# Patient Record
Sex: Female | Born: 1985 | Race: Black or African American | Hispanic: No | Marital: Single | Smoking: Current every day smoker
Health system: Southern US, Community
[De-identification: ages and names within clinical notes are randomized; demographics above are authoritative.]

## PROBLEM LIST (undated history)

## (undated) DIAGNOSIS — F32A Depression, unspecified: Secondary | ICD-10-CM

## (undated) DIAGNOSIS — N83209 Unspecified ovarian cyst, unspecified side: Secondary | ICD-10-CM

## (undated) DIAGNOSIS — O139 Gestational [pregnancy-induced] hypertension without significant proteinuria, unspecified trimester: Secondary | ICD-10-CM

## (undated) DIAGNOSIS — I219 Acute myocardial infarction, unspecified: Secondary | ICD-10-CM

## (undated) DIAGNOSIS — B999 Unspecified infectious disease: Secondary | ICD-10-CM

## (undated) DIAGNOSIS — A549 Gonococcal infection, unspecified: Secondary | ICD-10-CM

## (undated) DIAGNOSIS — F329 Major depressive disorder, single episode, unspecified: Secondary | ICD-10-CM

## (undated) DIAGNOSIS — R87629 Unspecified abnormal cytological findings in specimens from vagina: Secondary | ICD-10-CM

## (undated) HISTORY — PX: NO PAST SURGERIES: SHX2092

---
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
## 2006-02-26 ENCOUNTER — Inpatient Hospital Stay (HOSPITAL_COMMUNITY): Admission: AD | Admit: 2006-02-26 | Discharge: 2006-02-26 | Payer: Self-pay | Admitting: Obstetrics & Gynecology

## 2006-04-22 ENCOUNTER — Encounter: Payer: Self-pay | Admitting: Cardiology

## 2006-04-22 ENCOUNTER — Ambulatory Visit: Payer: Self-pay | Admitting: Cardiology

## 2006-04-22 ENCOUNTER — Inpatient Hospital Stay (HOSPITAL_COMMUNITY): Admission: EM | Admit: 2006-04-22 | Discharge: 2006-04-23 | Payer: Self-pay | Admitting: Emergency Medicine

## 2006-05-16 ENCOUNTER — Emergency Department (HOSPITAL_COMMUNITY): Admission: EM | Admit: 2006-05-16 | Discharge: 2006-05-17 | Payer: Self-pay | Admitting: Emergency Medicine

## 2007-01-29 ENCOUNTER — Inpatient Hospital Stay (HOSPITAL_COMMUNITY): Admission: AD | Admit: 2007-01-29 | Discharge: 2007-01-29 | Payer: Self-pay | Admitting: Obstetrics and Gynecology

## 2008-03-15 IMAGING — CR DG WRIST COMPLETE 3+V*R*
3 series · 3 of 3 positions shown · non-contrast
Comparison: none

CLINICAL DATA: Laceration to posterior side of hand this AM.
 RIGHT WRIST ? 3 VIEW:

[x wrist pa right]
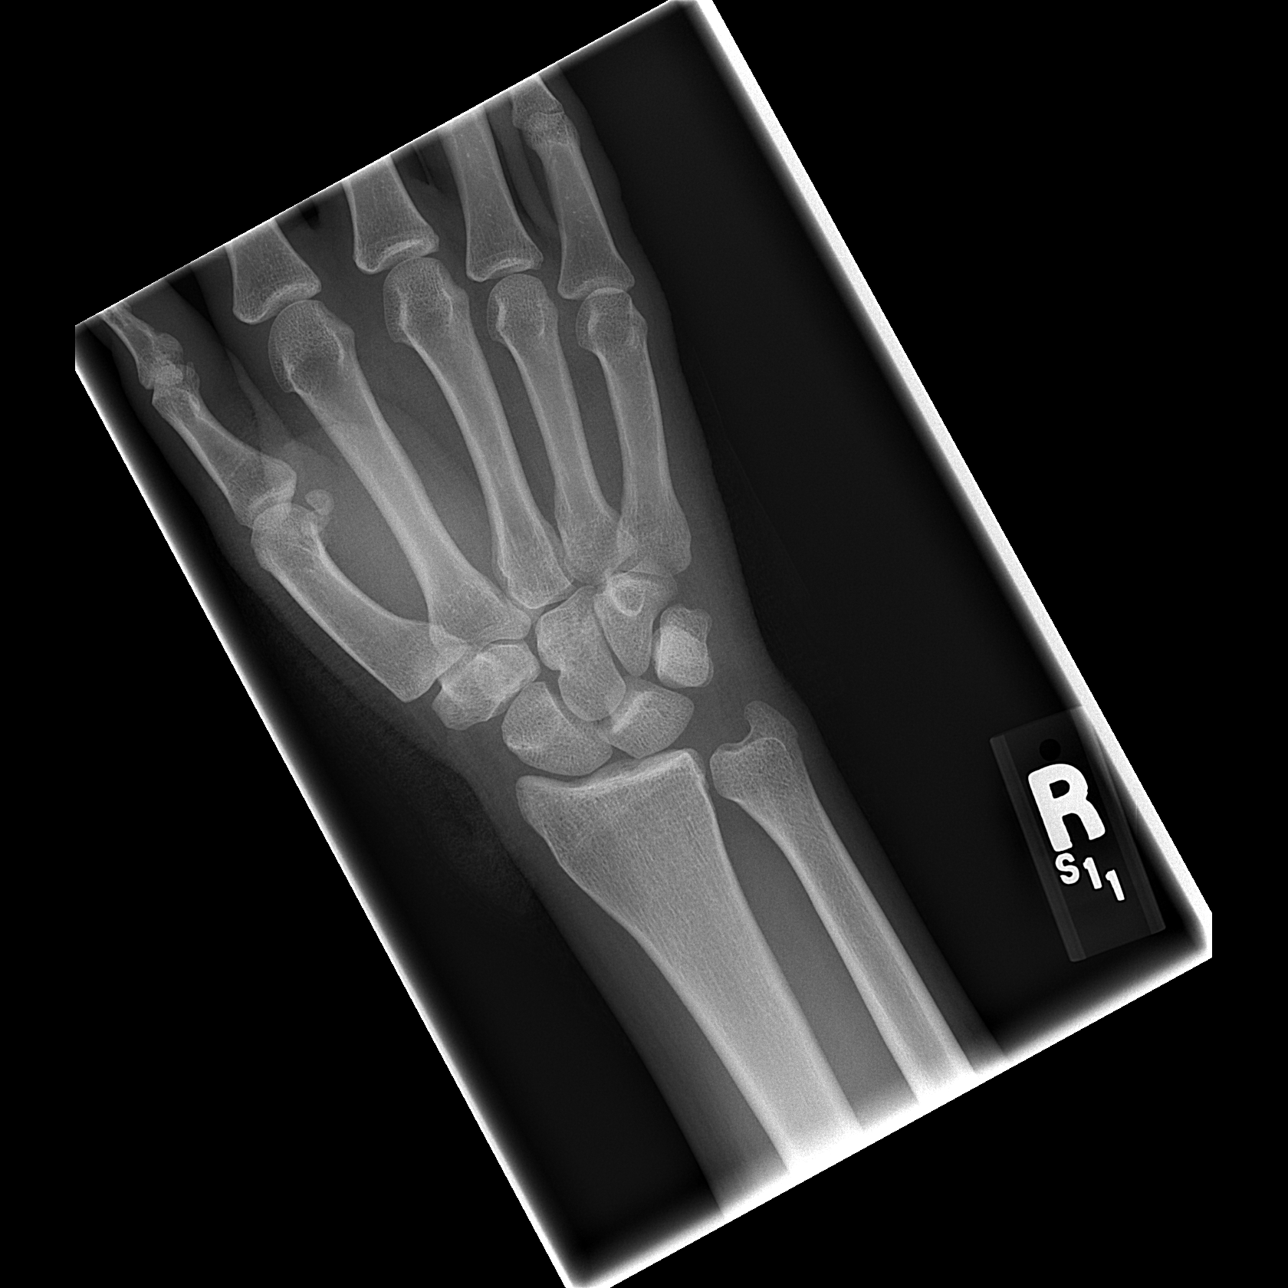

[x wrist obl right]
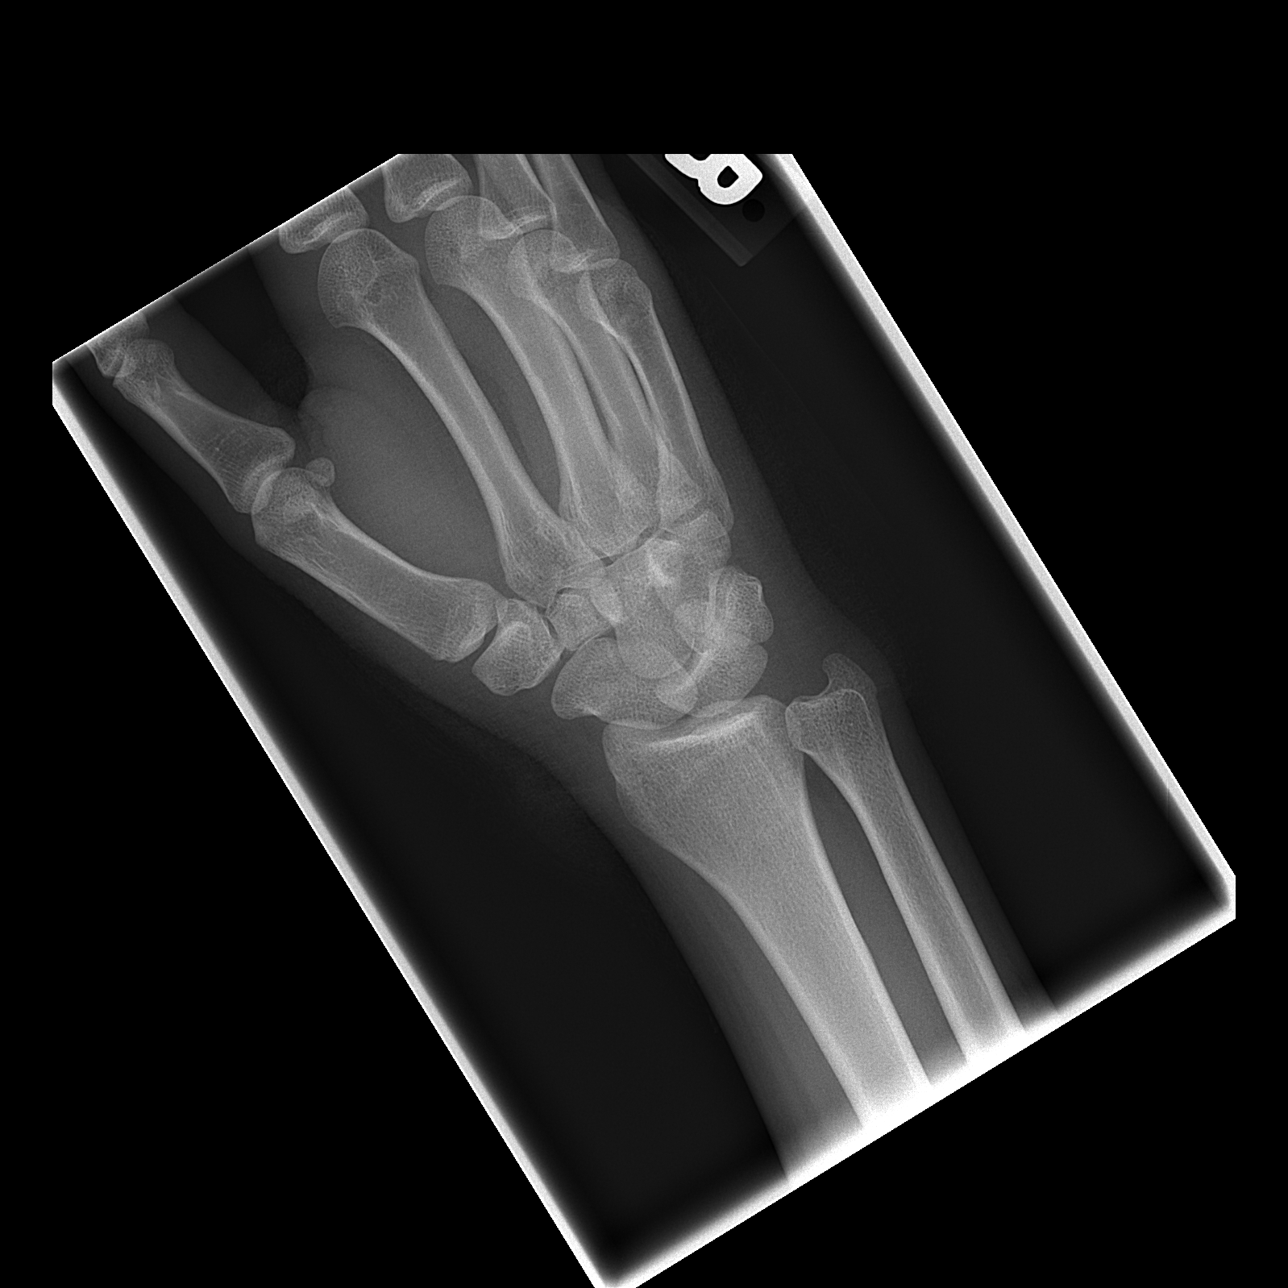

[x wrist lat right]
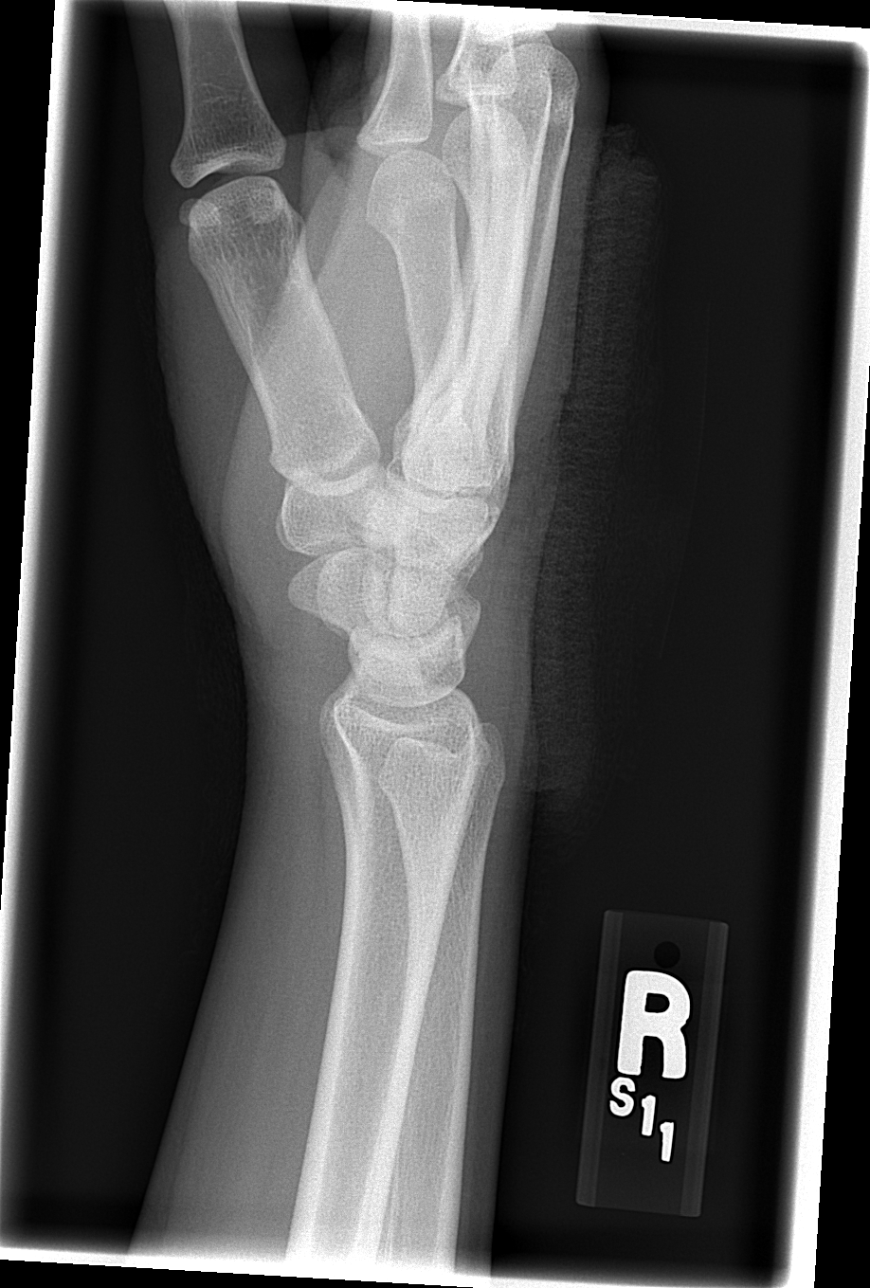

[3 of 3 positions shown; findings below may reference images not displayed]

FINDINGS: There is overlying gauze material along the dorsal surface of the hand.  
 There are no underlying fractures or dislocations noted.
IMPRESSION: No acute osseous abnormalities.

## 2008-04-28 ENCOUNTER — Inpatient Hospital Stay (HOSPITAL_COMMUNITY): Admission: AD | Admit: 2008-04-28 | Discharge: 2008-04-29 | Payer: Self-pay | Admitting: Family Medicine

## 2008-07-12 ENCOUNTER — Ambulatory Visit: Payer: Self-pay | Admitting: Obstetrics & Gynecology

## 2008-07-12 ENCOUNTER — Inpatient Hospital Stay (HOSPITAL_COMMUNITY): Admission: AD | Admit: 2008-07-12 | Discharge: 2008-07-12 | Payer: Self-pay | Admitting: Obstetrics & Gynecology

## 2008-07-19 ENCOUNTER — Inpatient Hospital Stay (HOSPITAL_COMMUNITY): Admission: AD | Admit: 2008-07-19 | Discharge: 2008-07-21 | Payer: Self-pay | Admitting: Obstetrics & Gynecology

## 2008-07-19 ENCOUNTER — Ambulatory Visit: Payer: Self-pay | Admitting: Obstetrics and Gynecology

## 2008-09-06 IMAGING — CR DG CHEST 2V
2 series · 2 of 2 positions shown · non-contrast
Comparison: none

CLINICAL DATA: Cough and chest pain.
 CHEST - 2 VIEW:
 PA and lateral chest.  No comparison.

[w chest pa]
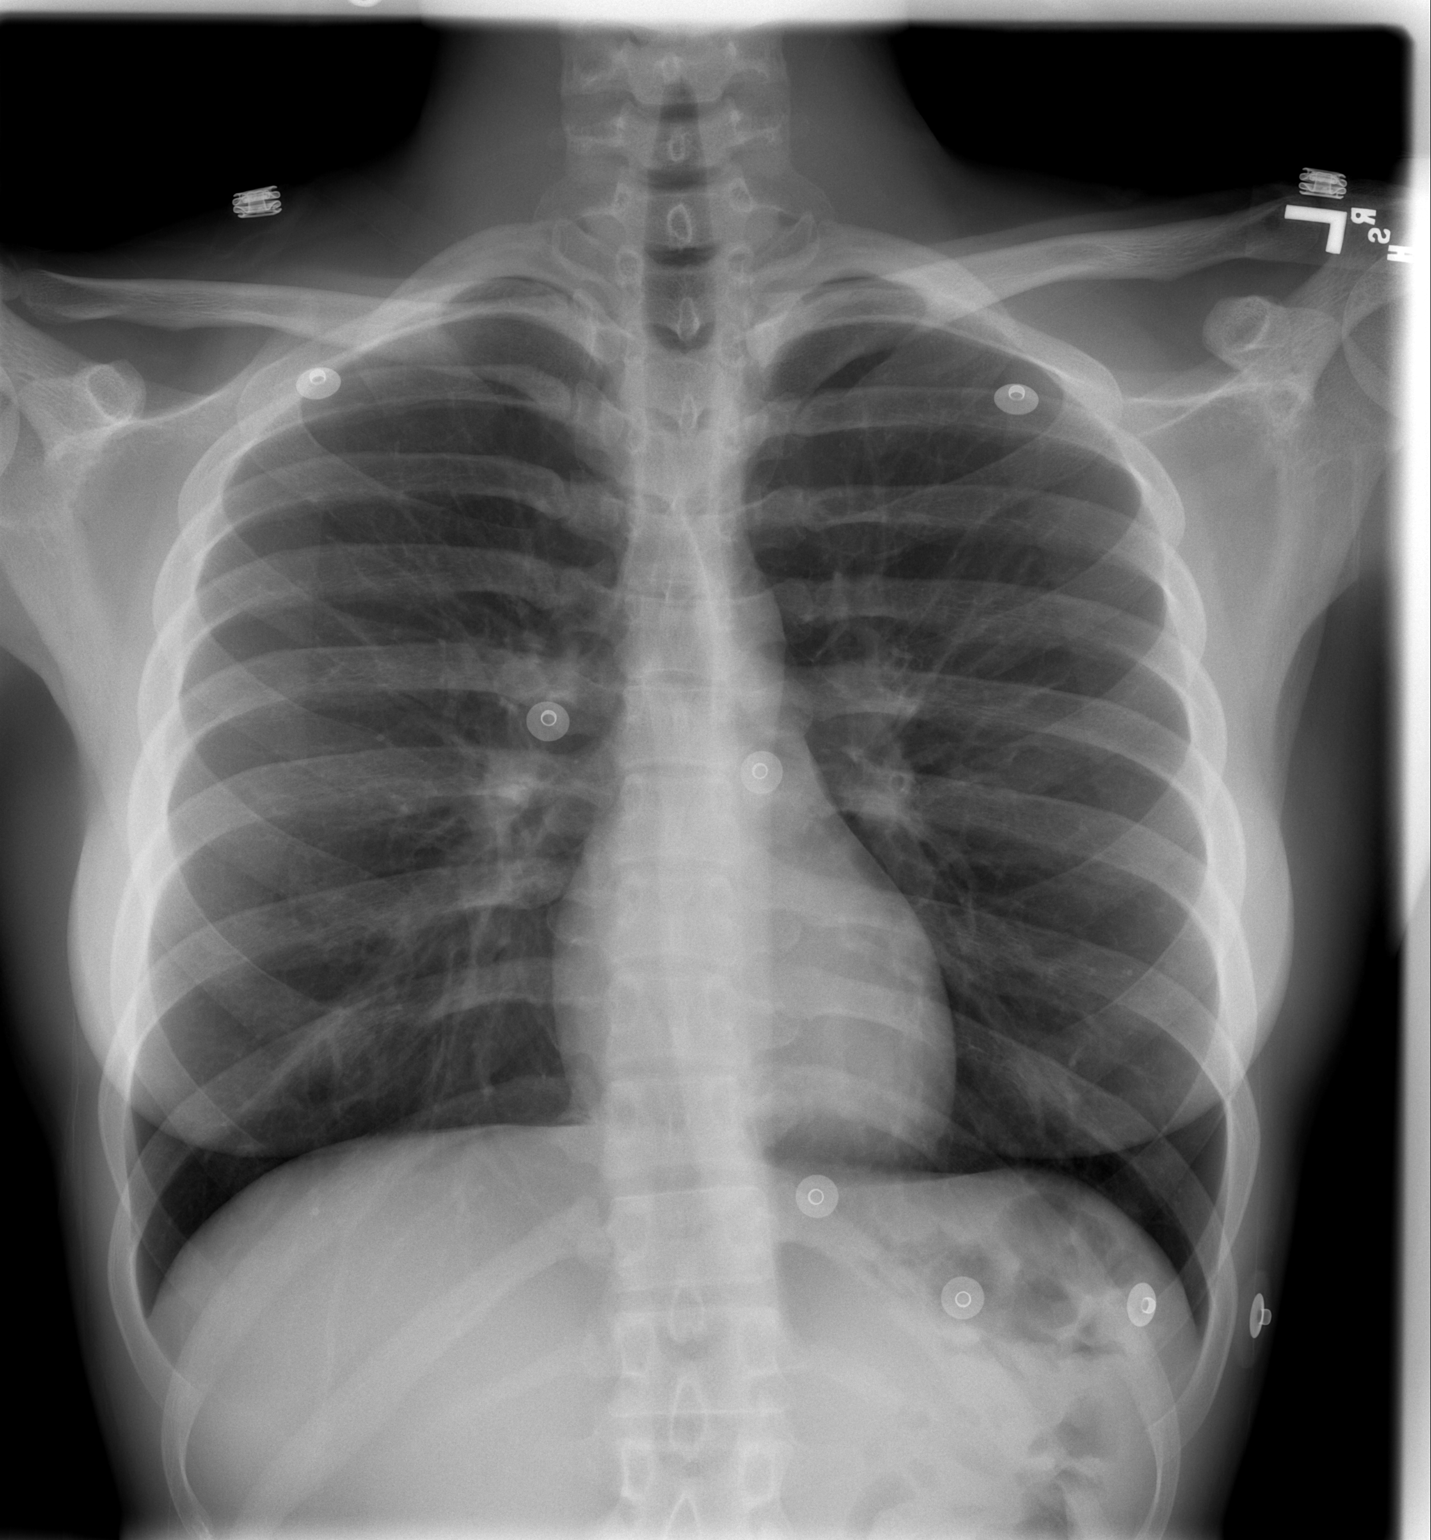

[w chest lat]
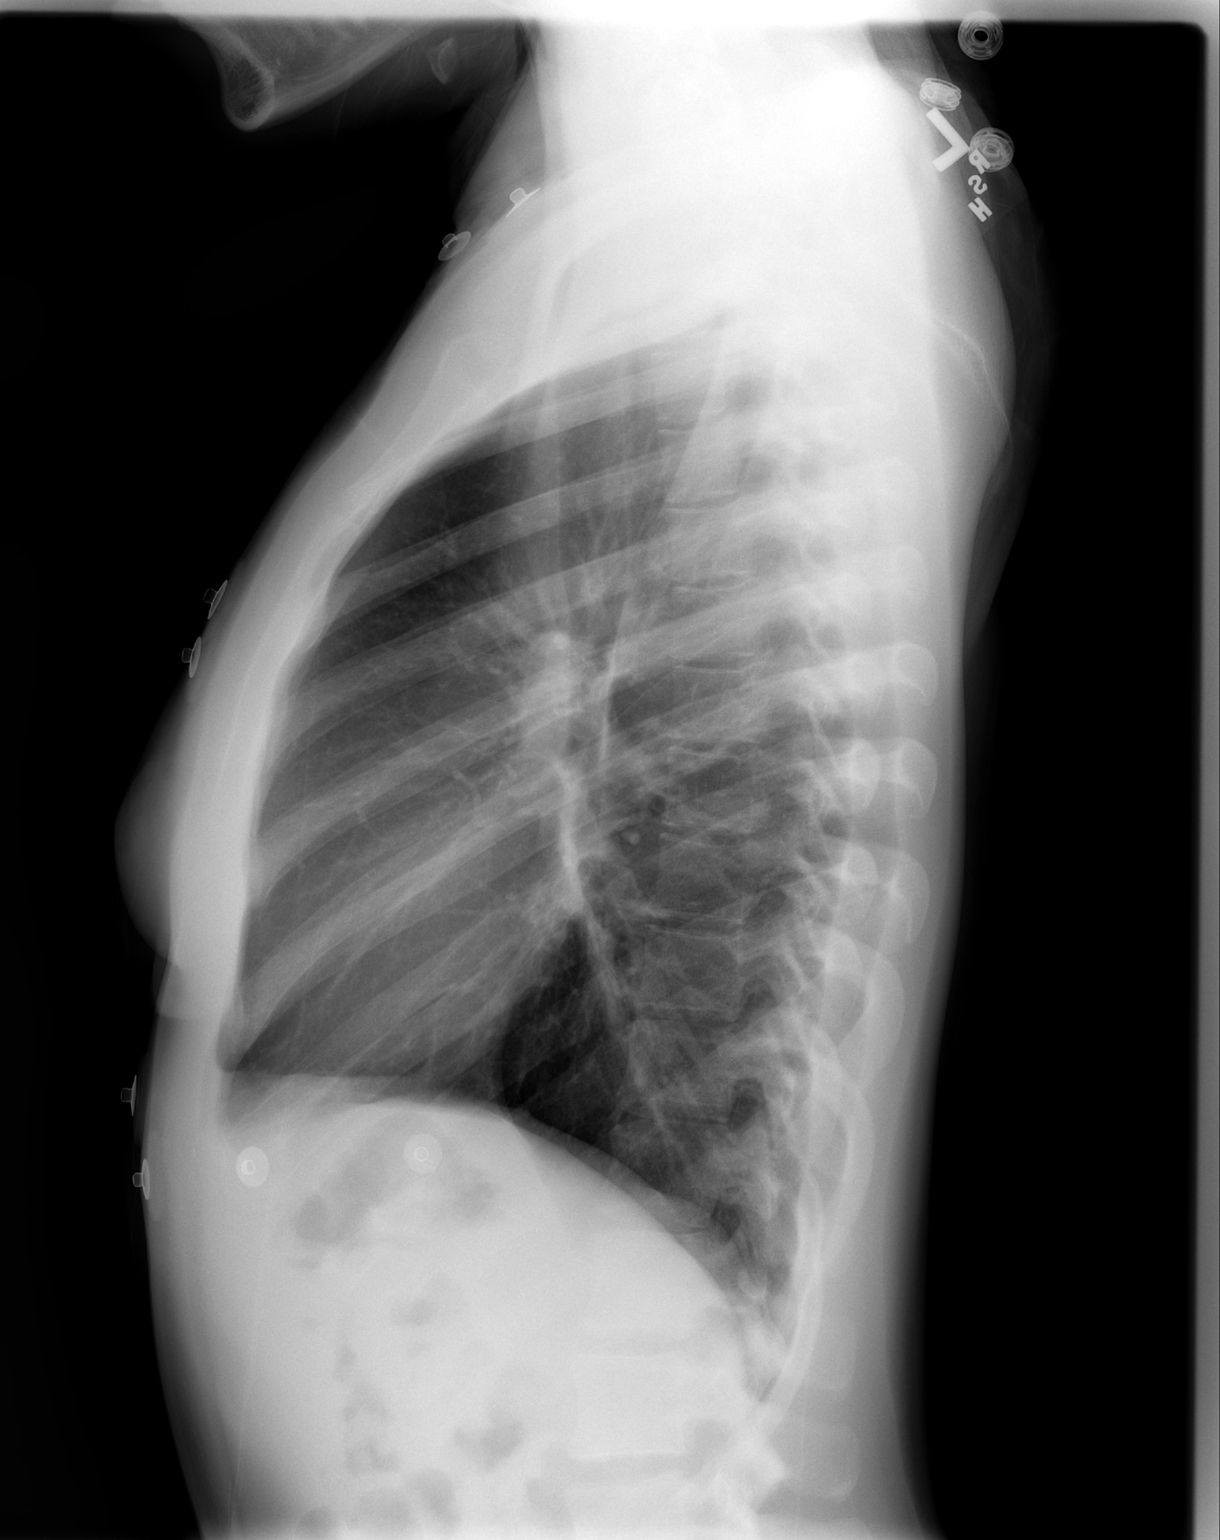

[2 of 2 positions shown; findings below may reference images not displayed]

FINDINGS: The heart size and mediastinal contours are within normal limits.  Both lungs are clear.  The visualized skeletal structures are within normal limits.
IMPRESSION: No active cardiopulmonary disease.

## 2009-10-02 ENCOUNTER — Emergency Department (HOSPITAL_COMMUNITY): Admission: EM | Admit: 2009-10-02 | Discharge: 2009-10-02 | Payer: Self-pay | Admitting: Emergency Medicine

## 2009-11-12 ENCOUNTER — Encounter: Payer: Self-pay | Admitting: Obstetrics & Gynecology

## 2009-11-12 ENCOUNTER — Ambulatory Visit: Payer: Self-pay | Admitting: Obstetrics & Gynecology

## 2009-11-12 ENCOUNTER — Inpatient Hospital Stay (HOSPITAL_COMMUNITY): Admission: AD | Admit: 2009-11-12 | Discharge: 2009-11-14 | Payer: Self-pay | Admitting: Obstetrics & Gynecology

## 2010-02-23 ENCOUNTER — Encounter: Payer: Self-pay | Admitting: *Deleted

## 2010-04-17 LAB — STREP B DNA PROBE

## 2010-04-17 LAB — CBC
HCT: 33.8 % — ABNORMAL LOW (ref 36.0–46.0)
Hemoglobin: 11.5 g/dL — ABNORMAL LOW (ref 12.0–15.0)
MCH: 34.4 pg — ABNORMAL HIGH (ref 26.0–34.0)
MCHC: 34.1 g/dL (ref 30.0–36.0)
MCV: 101 fL — ABNORMAL HIGH (ref 78.0–100.0)
Platelets: 222 10*3/uL (ref 150–400)
RBC: 3.35 MIL/uL — ABNORMAL LOW (ref 3.87–5.11)
RDW: 13.6 % (ref 11.5–15.5)
WBC: 12.6 10*3/uL — ABNORMAL HIGH (ref 4.0–10.5)

## 2010-04-17 LAB — URINALYSIS, ROUTINE W REFLEX MICROSCOPIC
Bilirubin Urine: NEGATIVE
Glucose, UA: 100 mg/dL — AB
Ketones, ur: NEGATIVE mg/dL
Nitrite: NEGATIVE
Urobilinogen, UA: 1 mg/dL (ref 0.0–1.0)

## 2010-04-17 LAB — DIFFERENTIAL
Basophils Absolute: 0 10*3/uL (ref 0.0–0.1)
Basophils Relative: 0 % (ref 0–1)
Eosinophils Absolute: 0.2 10*3/uL (ref 0.0–0.7)
Eosinophils Relative: 2 % (ref 0–5)
Lymphocytes Relative: 31 % (ref 12–46)
Lymphs Abs: 3.9 10*3/uL (ref 0.7–4.0)
Monocytes Absolute: 1 10*3/uL (ref 0.1–1.0)
Monocytes Relative: 8 % (ref 3–12)
Neutro Abs: 7.6 10*3/uL (ref 1.7–7.7)
Neutrophils Relative %: 60 % (ref 43–77)

## 2010-04-17 LAB — COMPREHENSIVE METABOLIC PANEL
Alkaline Phosphatase: 155 U/L — ABNORMAL HIGH (ref 39–117)
BUN: 3 mg/dL — ABNORMAL LOW (ref 6–23)
CO2: 25 mEq/L (ref 19–32)
Chloride: 105 mEq/L (ref 96–112)
Creatinine, Ser: 0.54 mg/dL (ref 0.4–1.2)
GFR calc non Af Amer: >60 mL/min (ref >60)
Glucose, Bld: 97 mg/dL (ref 70–99)
Potassium: 3.4 mEq/L — ABNORMAL LOW (ref 3.5–5.1)
Total Bilirubin: 0.2 mg/dL — ABNORMAL LOW (ref 0.3–1.2)

## 2010-04-17 LAB — COCAINE, URINE, CONFIRMATION: Benzoylecgonine GC/MS Conf: 42775 NG/ML — ABNORMAL HIGH

## 2010-04-17 LAB — URIC ACID: Uric Acid, Serum: 3 mg/dL (ref 2.4–7.0)

## 2010-04-17 LAB — RAPID HIV SCREEN (WH-MAU): Rapid HIV Screen: NONREACTIVE

## 2010-04-17 LAB — DRUGS OF ABUSE SCREEN W/O ALC, ROUTINE URINE
Amphetamine Screen, Ur: NEGATIVE
Barbiturate Quant, Ur: NEGATIVE
Cocaine Metabolites: POSITIVE — AB
Creatinine,U: 50.2 mg/dL
Marijuana Metabolite: NEGATIVE
Propoxyphene: NEGATIVE

## 2010-04-17 LAB — URINE MICROSCOPIC-ADD ON

## 2010-04-17 LAB — BENZODIAZEPINE, QUANTITATIVE, URINE
Flurazepam GC/MS Conf: NEGATIVE NG/ML
Lorazepam UR QT: NEGATIVE NG/ML

## 2010-04-17 LAB — LACTATE DEHYDROGENASE: LDH: 136 U/L (ref 94–250)

## 2010-04-17 LAB — TYPE AND SCREEN
ABO/RH(D): O POS
Antibody Screen: NEGATIVE

## 2010-04-17 LAB — RUBELLA SCREEN: Rubella: 21.7 IU/mL — ABNORMAL HIGH

## 2010-04-17 LAB — RPR: RPR Ser Ql: NONREACTIVE

## 2010-04-17 LAB — HEPATITIS B SURFACE ANTIGEN: Hepatitis B Surface Ag: NEGATIVE

## 2010-05-12 LAB — RAPID HIV SCREEN (WH-MAU): Rapid HIV Screen: NONREACTIVE

## 2010-05-12 LAB — URINE MICROSCOPIC-ADD ON

## 2010-05-12 LAB — URINE CULTURE
Colony Count: NO GROWTH
Culture: NO GROWTH

## 2010-05-12 LAB — COMPREHENSIVE METABOLIC PANEL
AST: 23 U/L (ref 0–37)
Albumin: 3.4 g/dL — ABNORMAL LOW (ref 3.5–5.2)
Alkaline Phosphatase: 141 U/L — ABNORMAL HIGH (ref 39–117)
BUN: 5 mg/dL — ABNORMAL LOW (ref 6–23)
CO2: 21 mEq/L (ref 19–32)
Chloride: 102 mEq/L (ref 96–112)
Potassium: 3.8 mEq/L (ref 3.5–5.1)
Total Bilirubin: 1 mg/dL (ref 0.3–1.2)

## 2010-05-12 LAB — RAPID URINE DRUG SCREEN, HOSP PERFORMED
Benzodiazepines: NOT DETECTED
Cocaine: POSITIVE — AB
Tetrahydrocannabinol: NOT DETECTED

## 2010-05-12 LAB — DIFFERENTIAL
Basophils Absolute: 0 10*3/uL (ref 0.0–0.1)
Eosinophils Relative: 2 % (ref 0–5)
Lymphocytes Relative: 22 % (ref 12–46)
Neutro Abs: 7.9 10*3/uL — ABNORMAL HIGH (ref 1.7–7.7)
Neutrophils Relative %: 68 % (ref 43–77)

## 2010-05-12 LAB — URINALYSIS, ROUTINE W REFLEX MICROSCOPIC
Protein, ur: NEGATIVE mg/dL
Urobilinogen, UA: 0.2 mg/dL (ref 0.0–1.0)

## 2010-05-12 LAB — RUBELLA SCREEN: Rubella: 16.3 IU/mL — ABNORMAL HIGH

## 2010-05-12 LAB — CBC
HCT: 36.1 % (ref 36.0–46.0)
Platelets: 190 10*3/uL (ref 150–400)
RDW: 13.6 % (ref 11.5–15.5)
WBC: 11.7 10*3/uL — ABNORMAL HIGH (ref 4.0–10.5)

## 2010-05-12 LAB — URIC ACID: Uric Acid, Serum: 3.3 mg/dL (ref 2.4–7.0)

## 2010-05-12 LAB — WET PREP, GENITAL: Clue Cells Wet Prep HPF POC: NONE SEEN

## 2010-05-12 LAB — GC/CHLAMYDIA PROBE AMP, GENITAL: GC Probe Amp, Genital: POSITIVE — AB

## 2010-05-12 LAB — HEPATITIS B SURFACE ANTIGEN: Hepatitis B Surface Ag: NEGATIVE

## 2010-05-12 LAB — TYPE AND SCREEN

## 2010-05-12 LAB — STREP B DNA PROBE: Strep Group B Ag: NEGATIVE

## 2010-06-20 NOTE — Discharge Summary (Signed)
NAMEGAVRIELLE, Pamela [name]              ACCOUNT NO.:  [id number]   MEDICAL RECORD NO.:  [id number]            PATIENT TYPE:   LOCATION:                                 FACILITY:   PHYSICIAN:  Pamela [name], MDDATE OF BIRTH:  1985-08-06   DATE OF ADMISSION:  DATE OF DISCHARGE:                               DISCHARGE SUMMARY   ADDENDUM:  The patient's chest x-ray showed no active cardiopulmonary  disease.   FOLLOWUP:  The patient has been asked to follow up with her primary care  physician.  She can follow up with Dr. Diona Browner in the [facility] on a p.r.n. basis.      Tereso Newcomer, PA-C      Pamela Buckles. Bensimhon, MD  Electronically Signed    SW/MEDQ  D:  04/23/2006  T:  04/23/2006  Job:  [id number]

## 2010-06-20 NOTE — Discharge Summary (Signed)
Pamela [name], Pamela [name]              ACCOUNT NO.:  [id number]   MEDICAL RECORD NO.:  [id number]          PATIENT TYPE:  INP   LOCATION:  4737                         FACILITY:  [facility]   PHYSICIAN:  Bevelyn Buckles. Bensimhon, MDDATE OF BIRTH:  05-Jan-1986   DATE OF ADMISSION:  04/22/2006  DATE OF DISCHARGE:  04/23/2006                               DISCHARGE SUMMARY   CARDIOLOGIST:  She is new to [facility].  She was initially seen  by Dr. Nona Dell.   PRIMARY CARE PHYSICIAN:  She follows with the [facility] as well as [facility].   REASON FOR ADMISSION:  Chest pain.   DISCHARGE DIAGNOSES:  1. Chest pain syndrome in the setting of cocaine abuse.      a.     Nonischemic Myoview study this admission.      b.     Good left ventricular function.  2. Substance abuse.   HISTORY:  Ms. Maiden is a 25 year old female with a history of cocaine  and crack abuse who presented to the emergency room on the date of  admission with complaints of chest pain.  She smoked crack for several  hours prior to presenting to the hospital.  Her initial point of care  markers were elevated with an MB of 35.6 and a troponin I 0.86.  There  were point of care markers.  She had not eaten in several days and  complained of being hungry.  Her EKG was without acute change.  She was  admitted for further evaluation and treatment.   HOSPITAL COURSE:  The patient was admitted for further evaluation and  treatment of her chest pain which seemed to be consistent with a non-ST-  elevation myocardial infarction in the setting of crack abuse.  No beta  blockers were used secondary to cocaine abuse.  She was placed on  aspirin, Nitroglycerin paste and Diltiazem.  No beta-blocker review  secondary to her recent history of cocaine abuse.  Her regular cardiac  markers returned normal x3.  Her LDL was 73.  TSH was normal 2.613.  A 2-  D echocardiogram showed normal LV function and  no wall motion  abnormalities.  She underwent adenosine Myoview testing on the date of  April 23, 2006.  This was reviewed by Dr. Gala Romney.  She had an EF of  61%, no ischemia or infarct.  She was felt stable enough for discharge  to home.   Regarding the patient's social situation,  she does note that she does  abuse crack cocaine.  She also has an uncertain living situation.  She  says she travels from place to place.  She does have a 26-year-old  daughter that lives with her foster mother.  We have asked social work  to come by and see the patient to help with living arrangements as well  as to help with outpatient treatment of her substance abuse prior to  discharge.   LABORATORY AND ANCILLARY DATA:  White count 9300, hemoglobin 12.8,  hematocrit 37.5, platelet count 261,000.  INR 1.1.  Sodium 137,  potassium 3.6, glucose 64, BUN 11, creatinine 0.76.  LFTs okay regular.  CK-MB and troponin I negative x3.  Lipid panel as noted above.  TSH as  noted above.  Urine drug screen positive for cocaine.   DISCHARGE MEDICATIONS:  None.   DIET:  Low-fat, low-sodium diet.   WOUND CARE:  Not applicable.   ACTIVITY:  She is increase her activity slowly.  She may return to work  on April 26, 2006.  She is advised to stop smoking cigarettes and to  stop using cocaine.  As noted above, social work will see her prior to  discharge to help with living arrangements as well as outpatient  treatment for her substance abuse.   TOTAL PHYSICIAN PA TIME:  Greater than 30 minutes on this discharge.      Tereso Newcomer, PA-C      Bevelyn Buckles. Bensimhon, MD  Electronically Signed    SW/MEDQ  D:  04/23/2006  T:  04/23/2006  Job:  [id number]   cc:   [facility]  [facility]

## 2010-06-20 NOTE — H&P (Signed)
[name], [name] NO.:  [id number]   MEDICAL RECORD NO.:  [id number]          PATIENT TYPE:  EMS   LOCATION:  MAJO                         FACILITY:  [facility]   PHYSICIAN:  Jonelle Sidle, MD DATE OF BIRTH:  30-Apr-1985   DATE OF ADMISSION:  04/22/2006  DATE OF DISCHARGE:                              HISTORY & PHYSICAL   PRIMARY CARE PHYSICIAN:  None.   REASON FOR ADMISSION:  Recent crack cocaine use, with chest pain and  abnormal cardiac markers.   HISTORY OF PRESENT ILLNESS:  Ms. Pamela [name] is a 25 year old woman with a  history of polysubstance abuse, including most recently crack cocaine  which she states she began using back in January 2008.  She reports no  other major medical conditions or hospitalizations.  She is presently  unemployed and lives from place to place, having dropped out of high  school in the ninth grade, and with one 50-year-old daughter who lives in  foster care.  She has recently been on a crack cocaine binge, stating  that she started smoking crack at 1 p.m. yesterday, through the entire  night, until approximately 4 a.m. this morning, at which point she began  to develop chest pain.  She states that a friend called EMS, and she was  transported to the emergency department.  Her electrocardiogram showed  sinus rhythm, with nonspecific ST changes.  There is no old tracing for  comparison.  Her point of care cardiac markers are abnormal, with a CK-  MB of 35.6, and a troponin I of 0.86.  Urine drug screen is positive for  cocaine. We were asked to evaluate the patient by emergency department  staff.  In speaking with her at the bedside, she denies having active  chest pain, stating only that she is hungry.   ALLERGIES:  PENICILLIN.   MEDICATIONS:  Naproxen p.r.n.   PAST MEDICAL HISTORY:  As outlined above.   SOCIAL HISTORY:  As discussed above.  Also includes one-half pack per  day tobacco use history for the last 2-3 years.  She  denies any alcohol  use.   FAMILY HISTORY:  No clear premature cardiovascular disease based on  limited information.  The patient states that her mother may have  cervical cancer at age 33.   REVIEW OF SYSTEMS:  As described in the history of present illness.  Otherwise negative.   PHYSICAL EXAMINATION:  VITAL SIGNS:  Temperature is 98.4 degrees, heart  rate 97, in sinus rhythm, oxygen saturation 100% on room air,  respirations 20, blood pressure 121/83.  GENERAL:  This is an overweight young woman, in no acute distress.  HEENT:  Conjunctivae and lids are normal.  Oropharynx clear.  NECK:  Supple.  No elevated jugular venous pressure or loud carotid  bruits.  LUNGS:  Generally clear, without labored breathing.  CARDIAC:  Reveals a regular rate and rhythm.  No loud murmur,  pericardial rub, or S3 gallop.  ABDOMEN:  Soft.  No obvious hepatomegaly.  Bowel sounds present.  EXTREMITIES:  Exhibit no pitting edema.  Distal pulses 2+.  SKIN:  Warm and dry.  MUSCULOSKELETAL:  No kyphosis is noted.  NEUROPSYCHIATRIC:  The patient is alert and oriented x3.   LABORATORY DATA:  Laboratory data is limited, as the patient refused  subsequent blood draws.   Chest x-ray reports no acute findings.   IMPRESSION:  1. Chest pain and abnormal cardiac markers in the setting of a crack      cocaine binge.  This is in an unfortunate 25 year old woman with a      history of polysubstance abuse, documented positive urine drug      screen for cocaine, and no clear history of baseline cardiovascular      disease.  Her electrocardiogram is nonspecific, without acute      injury current.  Chest x-ray is unremarkable.  2. No other reported major medical conditions.   PLAN:  I discussed the situation with the patient and explained to her  that she had manifested some signs of myocardial damage, likely  precipitated by cocaine use.  I would be most concerned about vasospasm  rather than underlying  obstructive coronary artery disease in this 63-  year-old woman.  We will plan to admit her to the hospital for further  observation.  She will be placed on aspirin, nitroglycerin, and a  calcium channel blocker, but no other systemic anticoagulation at this  time.  We will follow cardiac markers and schedule a 2D echocardiogram  to assess overall cardiac structure and function, as well as a baseline  adenosine Myoview as an inpatient.  Will also ask for a formal social  work consultation to look into this patient's living conditions and  perhaps assist with further recommendations.  I did clearly explain to  Ms. Ou that she should abstain from any and all substance abuse and  imparted to her that she is risking her life with these practices.  Further plans to follow.      Jonelle Sidle, MD  Electronically Signed     SGM/MEDQ  D:  04/22/2006  T:  04/22/2006  Job:  [id number]

## 2010-11-07 LAB — URINE CULTURE: Colony Count: >=100000

## 2010-11-07 LAB — URINALYSIS, ROUTINE W REFLEX MICROSCOPIC
Ketones, ur: NEGATIVE
Nitrite: POSITIVE — AB
pH: 5.5

## 2010-11-07 LAB — DIFFERENTIAL
Lymphocytes Relative: 40
Monocytes Absolute: 0.5
Monocytes Relative: 7
Neutro Abs: 3.7

## 2010-11-07 LAB — POCT PREGNANCY, URINE
Operator id: 117411
Preg Test, Ur: NEGATIVE

## 2010-11-07 LAB — WET PREP, GENITAL

## 2010-11-07 LAB — URINE MICROSCOPIC-ADD ON

## 2010-11-07 LAB — CBC
Hemoglobin: 12.9
MCHC: 34.2
RBC: 3.85 — ABNORMAL LOW

## 2011-01-11 ENCOUNTER — Emergency Department (HOSPITAL_COMMUNITY)
Admission: EM | Admit: 2011-01-11 | Discharge: 2011-01-11 | Disposition: A | Discharge status: Home / Self Care | Payer: Self-pay | Attending: Emergency Medicine | Admitting: Emergency Medicine

## 2011-01-11 DIAGNOSIS — J029 Acute pharyngitis, unspecified: Secondary | ICD-10-CM | POA: Insufficient documentation

## 2011-01-11 DIAGNOSIS — R590 Localized enlarged lymph nodes: Secondary | ICD-10-CM

## 2011-01-11 DIAGNOSIS — K089 Disorder of teeth and supporting structures, unspecified: Secondary | ICD-10-CM | POA: Insufficient documentation

## 2011-01-11 DIAGNOSIS — R599 Enlarged lymph nodes, unspecified: Secondary | ICD-10-CM | POA: Insufficient documentation

## 2011-01-11 NOTE — ED Provider Notes (Signed)
History     CSN: [id number] Arrival date & time: 01/11/2011  8:25 AM   First MD Initiated Contact with Patient 01/11/11 0957      Chief Complaint  Patient presents with  . Sore Throat  . Rectal Bleeding    (Consider location/radiation/quality/duration/timing/severity/associated sxs/prior treatment) Patient is a 25 y.o. female presenting with pharyngitis. The history is provided by the patient.  Sore Throat Associated symptoms include a sore throat. Pertinent negatives include no abdominal pain, arthralgias, chest pain, chills, congestion, coughing, fever, headaches, myalgias, nausea, neck pain, rash, vomiting or weakness.   Patient presents with submandibular right neck swelling, which seemed to start last night. The area is painful to the touch. It has not significantly increased in size since it initially appeared. The patient has pain in her throat only when swallowing with eating. Denies pain or difficulty with swallowing normally, difficulty breathing. She has not had a fever. Denies recent upper respiratory infection symptoms, n/v/d, or fatigue. No known sick contacts. Has not tried anything at home for tx.  She does have a filling to the R lower first molar which has been loose and painful for approx 1 year. She has not noted any swelling to the floor of her mouth.  History reviewed. No pertinent past medical history.  History reviewed. No pertinent past surgical history.  No family history on file.  History  Substance Use Topics  . Smoking status: Current Everyday Smoker  . Smokeless tobacco: Current User  . Alcohol Use: No    OB History    Grav Para Term Preterm Abortions TAB SAB Ect Mult Living                  Review of Systems  Constitutional: Negative for fever, chills, activity change and appetite change.  HENT: Positive for sore throat and dental problem. Negative for ear pain, congestion, facial swelling, rhinorrhea, mouth sores, trouble swallowing, neck  pain, neck stiffness and voice change.   Eyes: Negative for visual disturbance.  Respiratory: Negative for cough, shortness of breath and wheezing.   Cardiovascular: Negative for chest pain and palpitations.  Gastrointestinal: Negative for nausea, vomiting, abdominal pain and constipation.  Musculoskeletal: Negative for myalgias and arthralgias.  Skin: Negative for color change and rash.  Neurological: Negative for dizziness, facial asymmetry, weakness and headaches.    Allergies  Penicillins  Home Medications   Current Outpatient Rx  Name Route Sig Dispense Refill  . IBUPROFEN 200 MG PO TABS Oral Take 400 mg by mouth every 8 (eight) hours as needed. For pain.     Marland Kitchen RANITIDINE HCL 75 MG PO TABS Oral Take 75 mg by mouth once.        BP 115/79  Pulse 81  Temp(Src) 97.6 F (36.4 C) (Oral)  Resp 16  SpO2 100%  LMP 01/04/2011  Physical Exam  Nursing note and vitals reviewed. Constitutional: She is oriented to person, place, and time. She appears well-developed and well-nourished. No distress.       Pt in NAD, nontoxic appearing  HENT:  Head: Normocephalic and atraumatic.  Right Ear: External ear normal.  Left Ear: External ear normal.  Mouth/Throat: Oropharynx is clear and moist. No oropharyngeal exudate.       No tonsillar exudates or edema, no oropharyngeal erythema. No evidence of peritonsillar abscess.  Tender to palpation over R lower 1st molar. Filling in place. No evidence of abscess. No tenderness to palpation in floor of mouth. No trismus.  Eyes: Conjunctivae and EOM  are normal. Pupils are equal, round, and reactive to light.  Neck: Normal range of motion. Neck supple.       R submandibular lymphadenopathy, which is TTP. Overlying skin is not erythematous. No other area of swelling noted. Negative meningeal signs. No posterior tenderness.  Cardiovascular: Normal rate, regular rhythm and normal heart sounds.   Pulmonary/Chest: Effort normal and breath sounds normal.  She has no wheezes.  Abdominal: Soft. Bowel sounds are normal. There is no tenderness.  Lymphadenopathy:    She has cervical adenopathy.  Neurological: She is alert and oriented to person, place, and time. No cranial nerve deficit.  Skin: Skin is warm and dry. No rash noted. She is not diaphoretic.  Psychiatric: She has a normal mood and affect.    ED Course  Procedures (including critical care time)  Labs Reviewed - No data to display No results found.   No diagnosis found.    MDM  10:20 AM Pt assessed. No respiratory difficulty, able to swallow normally. States pain is only when swallowing food. Will order CBC, rapid strep, monospot and re-eval.  10:49 AM Patient left AMA. Nursing staff counseled patient on risks of leaving, but the patient wished to proceed. She is aware that she may return at any time for re-assessment.        Grant Fontana, [place] 01/11/11 1050

## 2011-01-11 NOTE — ED Notes (Signed)
Pt walking out, refuses further care states " I am hungry so I am going if it gets worse I'll come back" talked with pt, pt still requests to leave.

## 2011-01-11 NOTE — ED Notes (Signed)
Pt in with stated right side throat swelling/ pain to the neck as well as rectal bleeding for the past 2 months denies pain in the rectal area states pain in throat when eating and pain when moving neck

## 2011-01-11 NOTE — ED Provider Notes (Signed)
Medical screening examination/treatment/procedure(s) were performed by non-physician practitioner and as supervising physician I was immediately available for consultation/collaboration.  Ethelda Chick, MD 01/11/11 1115

## 2011-10-01 ENCOUNTER — Encounter (HOSPITAL_COMMUNITY): Payer: Self-pay | Admitting: Adult Health

## 2011-10-01 ENCOUNTER — Emergency Department (HOSPITAL_COMMUNITY)
Admission: EM | Admit: 2011-10-01 | Discharge: 2011-10-01 | Disposition: A | Discharge status: Home / Self Care | Payer: Self-pay | Attending: Emergency Medicine | Admitting: Emergency Medicine

## 2011-10-01 DIAGNOSIS — N9089 Other specified noninflammatory disorders of vulva and perineum: Secondary | ICD-10-CM | POA: Insufficient documentation

## 2011-10-01 NOTE — ED Notes (Signed)
C/o gumball sized cyst on the lip of labia associated with pain

## 2011-10-01 NOTE — ED Notes (Signed)
No answer in waiting room x2.

## 2011-10-28 ENCOUNTER — Emergency Department (HOSPITAL_COMMUNITY)
Admission: EM | Admit: 2011-10-28 | Discharge: 2011-10-28 | Disposition: A | Discharge status: Home / Self Care | Payer: Self-pay | Attending: Emergency Medicine | Admitting: Emergency Medicine

## 2011-10-28 ENCOUNTER — Encounter (HOSPITAL_COMMUNITY): Payer: Self-pay | Admitting: Adult Health

## 2011-10-28 DIAGNOSIS — Z880 Allergy status to penicillin: Secondary | ICD-10-CM | POA: Insufficient documentation

## 2011-10-28 DIAGNOSIS — H109 Unspecified conjunctivitis: Secondary | ICD-10-CM | POA: Insufficient documentation

## 2011-10-28 DIAGNOSIS — F17200 Nicotine dependence, unspecified, uncomplicated: Secondary | ICD-10-CM | POA: Insufficient documentation

## 2011-10-28 MED ORDER — TOBRAMYCIN 0.3 % OP SOLN: 1.0000 [drp] | OPHTHALMIC | Status: DC

## 2011-10-28 NOTE — ED Provider Notes (Signed)
History  This chart was scribed for Pamela Octave, MD by Ardeen Jourdain. This patient was seen in room TR02C/TR02C and the patient's care was started at 1925.   CSN: [id number]  Arrival date & time 10/28/11  1847   None     Chief Complaint  Patient presents with  . Conjunctivitis     The history is provided by the patient. No language interpreter was used.    Pamela [name] is a 26 y.o. female who presents to the Emergency Department complaining of itching in her right eye that began a few days ago with associated redness. She denies pain in the eye, fever, double vision, sensitivity to light or noise, nausea and emesis. She also denies sick contact. She states that the pain feels like there is something in her eye, but that her vision is normal. She wears glasses, but denies wearing contacts. She is a current everyday smoker but denies alcohol use.   She does not have a PCP.   History reviewed. No pertinent past medical history.  History reviewed. No pertinent past surgical history.  History reviewed. No pertinent family history.  History  Substance Use Topics  . Smoking status: Current Every Day Smoker  . Smokeless tobacco: Current User  . Alcohol Use: No   No OB history available.   Review of Systems  A complete 10 system review of systems was obtained and all systems are negative except as noted in the HPI and PMH.    Allergies  Penicillins  Home Medications   Current Outpatient Rx  Name Route Sig Dispense Refill  . TETRAHYDROZOLINE HCL 0.05 % OP SOLN Right Eye Place 2 drops into the right eye 4 (four) times daily.      Triage Vitals: BP 107/77  Pulse 101  Temp 99 F (37.2 C) (Oral)  Resp 16  SpO2 99%  LMP 09/25/2011  Physical Exam  Nursing note and vitals reviewed. Constitutional: She is oriented to person, place, and time. She appears well-developed and well-nourished. No distress.  HENT:  Head: Normocephalic and atraumatic.  Eyes: EOM are  normal. Pupils are equal, round, and reactive to light.       Right conjunctiva and scleral injection, no pain with EOM.  Neck: Neck supple. No tracheal deviation present.  Cardiovascular: Normal rate.   Pulmonary/Chest: Effort normal. No respiratory distress.  Musculoskeletal: Normal range of motion.  Neurological: She is alert and oriented to person, place, and time.  Skin: Skin is warm and dry.  Psychiatric: She has a normal mood and affect. Her behavior is normal.    ED Course  Procedures (including critical care time)  DIAGNOSTIC STUDIES: Oxygen Saturation is 99% on room air, normal by my interpretation.    COORDINATION OF CARE:  75- Discussed treatment plan with pt at bedside including antibiotic drops and pt agreed to plan.     Labs Reviewed - No data to display No results found.   No diagnosis found.    MDM  Right eye itching and redness x 1 day with sick contact.  No fever.  No change in vision.  Conjunctivitis. Tobramycin gtt.    I personally performed the services described in this documentation, which was scribed in my presence.  The recorded information has been reviewed and considered.    Pamela Octave, MD 10/29/11 1045

## 2011-10-28 NOTE — ED Notes (Signed)
Pt right eye red, swollen, itchy. Denies drainage at this time. Pt has been putting clear eyes in her eyes.

## 2011-10-28 NOTE — ED Notes (Signed)
C/o right eye itching and redness that began yesterday. Denies vision impairment/

## 2011-10-28 NOTE — ED Notes (Signed)
Pt d/c home in NAD. Pt voiced understanding of d/c instructions and follow up care.  

## 2012-02-28 ENCOUNTER — Emergency Department (HOSPITAL_COMMUNITY)
Admission: EM | Admit: 2012-02-28 | Discharge: 2012-02-28 | Disposition: A | Discharge status: Home / Self Care | Payer: Self-pay | Attending: Emergency Medicine | Admitting: Emergency Medicine

## 2012-02-28 ENCOUNTER — Encounter (HOSPITAL_COMMUNITY): Payer: Self-pay | Admitting: *Deleted

## 2012-02-28 DIAGNOSIS — K089 Disorder of teeth and supporting structures, unspecified: Secondary | ICD-10-CM | POA: Insufficient documentation

## 2012-02-28 DIAGNOSIS — K0889 Other specified disorders of teeth and supporting structures: Secondary | ICD-10-CM

## 2012-02-28 DIAGNOSIS — F17200 Nicotine dependence, unspecified, uncomplicated: Secondary | ICD-10-CM | POA: Insufficient documentation

## 2012-02-28 MED ORDER — OXYCODONE-ACETAMINOPHEN 5-325 MG PO TABS: 2.0000 | ORAL_TABLET | ORAL | Status: DC | PRN

## 2012-02-28 MED ORDER — CLINDAMYCIN HCL 150 MG PO CAPS: 300.0000 mg | ORAL_CAPSULE | Freq: Three times a day (TID) | ORAL | Status: DC

## 2012-02-28 MED ORDER — OXYCODONE-ACETAMINOPHEN 5-325 MG PO TABS
2.0000 | ORAL_TABLET | Freq: Once | ORAL | Status: AC
  Administered 2012-02-28: 2 via ORAL
  Filled 2012-02-28: qty 2

## 2012-02-28 NOTE — ED Notes (Signed)
Pt reports having a crown that has been hurting since yesterday.  No swelling or bruising noted.

## 2012-02-28 NOTE — ED Provider Notes (Signed)
History  This chart was scribed for non-physician practitioner working with Pamela Lennert, MD by Pamela [name], ED Scribe. This patient was seen in room TR07C/TR07C and the patient's care was started at 1938.  CSN: [id number]  Arrival date & time 02/28/12  1738   First MD Initiated Contact with Patient 02/28/12 1938      Chief Complaint  Patient presents with  . Dental Pain     The history is provided by the patient. No language interpreter was used.    Pamela [name] is a 27 y.o. female who presents to the Emergency Department complaining of dental pain that began yesterday and has been gradually improving. She states the pain was aggravated by a crown she had on her tooth that fell off in the waiting room. She states the pain improved after the crown fell off. She states she as not "seen a dentist in years." She denies any fever, nausea and emesis as associated symptoms.   History reviewed. No pertinent past medical history.  History reviewed. No pertinent past surgical history.  History reviewed. No pertinent family history.  History  Substance Use Topics  . Smoking status: Current Every Day Smoker  . Smokeless tobacco: Current User  . Alcohol Use: No   No OB history  Review of Systems  HENT: Positive for dental problem.   All other systems reviewed and are negative.    Allergies  Penicillins  Home Medications   Current Outpatient Rx  Name  Route  Sig  Dispense  Refill  . BENZOCAINE 10 % MT GEL   Mouth/Throat   Use as directed 1 application in the mouth or throat daily as needed. For tooth pain           Triage Vitals: BP 137/89  Pulse 100  Temp 98.4 F (36.9 C)  Resp 20  SpO2 100%  Physical Exam  Nursing note and vitals reviewed. Constitutional: She is oriented to person, place, and time. She appears well-developed and well-nourished. No distress.  HENT:  Head: Normocephalic and atraumatic.  Mouth/Throat: No oropharyngeal exudate.   Poor dentition, multiple cracked and decayed teeth, right lower molar tender to percussion   Eyes: EOM are normal. Pupils are equal, round, and reactive to light.  Neck: Normal range of motion. Neck supple. No tracheal deviation present.  Cardiovascular: Normal rate.   Pulmonary/Chest: Effort normal. No respiratory distress.  Abdominal: Soft. She exhibits no distension.  Musculoskeletal: Normal range of motion. She exhibits no edema.  Neurological: She is alert and oriented to person, place, and time.  Skin: Skin is warm and dry.  Psychiatric: She has a normal mood and affect. Her behavior is normal.    ED Course  Procedures (including critical care time)  DIAGNOSTIC STUDIES: Oxygen Saturation is 100% on room air, normal by my interpretation.    COORDINATION OF CARE:  7:54 PM: Discussed treatment plan which includes pain medication and antibiotics  with pt at bedside and pt agreed to plan.     Labs Reviewed - No data to display No results found.   1. Pain, dental       MDM    Pt given percocet for pain. Pt will be discharged with percocet, clindamycin and follow up with dentist  I personally performed the services described in this documentation, which was scribed in my presence. The recorded information has been reviewed and is accurate.    Pamela Beck, PA-C 02/29/12 0040

## 2012-02-29 NOTE — ED Provider Notes (Signed)
Medical screening examination/treatment/procedure(s) were performed by non-physician practitioner and as supervising physician I was immediately available for consultation/collaboration.   Darline Faith L Liam Bossman, MD 02/29/12 1240 

## 2012-03-19 ENCOUNTER — Other Ambulatory Visit: Payer: Self-pay

## 2012-12-08 ENCOUNTER — Other Ambulatory Visit: Payer: Self-pay

## 2013-07-25 ENCOUNTER — Encounter (HOSPITAL_COMMUNITY): Payer: Self-pay | Admitting: *Deleted

## 2013-07-25 ENCOUNTER — Inpatient Hospital Stay (HOSPITAL_COMMUNITY)
Admission: AD | Admit: 2013-07-25 | Discharge: 2013-07-27 | DRG: 776 | Disposition: A | Discharge status: Home / Self Care | Payer: Medicaid Other | Source: Ambulatory Visit | Attending: Obstetrics and Gynecology | Admitting: Obstetrics and Gynecology

## 2013-07-25 DIAGNOSIS — O99893 Other specified diseases and conditions complicating puerperium: Secondary | ICD-10-CM

## 2013-07-25 DIAGNOSIS — Z22330 Carrier of Group B streptococcus: Secondary | ICD-10-CM

## 2013-07-25 DIAGNOSIS — O9989 Other specified diseases and conditions complicating pregnancy, childbirth and the puerperium: Secondary | ICD-10-CM

## 2013-07-25 DIAGNOSIS — F1410 Cocaine abuse, uncomplicated: Secondary | ICD-10-CM

## 2013-07-25 DIAGNOSIS — O99335 Smoking (tobacco) complicating the puerperium: Principal | ICD-10-CM | POA: Diagnosis present

## 2013-07-25 DIAGNOSIS — O99345 Other mental disorders complicating the puerperium: Secondary | ICD-10-CM | POA: Diagnosis present

## 2013-07-25 LAB — COMPREHENSIVE METABOLIC PANEL
ALK PHOS: 150 U/L — AB (ref 39–117)
ALT: 7 U/L (ref 0–35)
AST: 17 U/L (ref 0–37)
Albumin: 3 g/dL — ABNORMAL LOW (ref 3.5–5.2)
BILIRUBIN TOTAL: 0.3 mg/dL (ref 0.3–1.2)
BUN: 7 mg/dL (ref 6–23)
CO2: 24 meq/L (ref 19–32)
Calcium: 9.7 mg/dL (ref 8.4–10.5)
Chloride: 101 mEq/L (ref 96–112)
Creatinine, Ser: 0.62 mg/dL (ref 0.50–1.10)
GLUCOSE: 86 mg/dL (ref 70–99)
POTASSIUM: 4 meq/L (ref 3.7–5.3)
SODIUM: 136 meq/L — AB (ref 137–147)
Total Protein: 6.6 g/dL (ref 6.0–8.3)

## 2013-07-25 LAB — RAPID URINE DRUG SCREEN, HOSP PERFORMED
AMPHETAMINES: NOT DETECTED
BENZODIAZEPINES: NOT DETECTED
Barbiturates: NOT DETECTED
COCAINE: POSITIVE — AB
OPIATES: NOT DETECTED
TETRAHYDROCANNABINOL: NOT DETECTED

## 2013-07-25 LAB — PROTEIN / CREATININE RATIO, URINE
Creatinine, Urine: 65.29 mg/dL
Protein Creatinine Ratio: 0.96 — ABNORMAL HIGH (ref 0.00–0.15)
Total Protein, Urine: 62.8 mg/dL

## 2013-07-25 LAB — TYPE AND SCREEN
ABO/RH(D): O POS
ANTIBODY SCREEN: NEGATIVE

## 2013-07-25 LAB — CBC
HCT: 31.7 % — ABNORMAL LOW (ref 36.0–46.0)
HEMOGLOBIN: 10.7 g/dL — AB (ref 12.0–15.0)
MCH: 32.5 pg (ref 26.0–34.0)
MCHC: 33.8 g/dL (ref 30.0–36.0)
MCV: 96.4 fL (ref 78.0–100.0)
PLATELETS: 173 10*3/uL (ref 150–400)
RBC: 3.29 MIL/uL — AB (ref 3.87–5.11)
RDW: 13.4 % (ref 11.5–15.5)
WBC: 15.2 10*3/uL — ABNORMAL HIGH (ref 4.0–10.5)

## 2013-07-25 LAB — RAPID HIV SCREEN (WH-MAU): Rapid HIV Screen: NONREACTIVE

## 2013-07-25 MED ORDER — DIBUCAINE 1 % RE OINT: 1.0000 "application " | TOPICAL_OINTMENT | RECTAL | Status: DC | PRN

## 2013-07-25 MED ORDER — ONDANSETRON HCL 4 MG PO TABS: 4.0000 mg | ORAL_TABLET | ORAL | Status: DC | PRN

## 2013-07-25 MED ORDER — SIMETHICONE 80 MG PO CHEW: 80.0000 mg | CHEWABLE_TABLET | ORAL | Status: DC | PRN

## 2013-07-25 MED ORDER — IBUPROFEN 600 MG PO TABS
600.0000 mg | ORAL_TABLET | Freq: Four times a day (QID) | ORAL | Status: DC
  Administered 2013-07-25 – 2013-07-27 (×7): 600 mg via ORAL
  Filled 2013-07-25 (×6): qty 1

## 2013-07-25 MED ORDER — OXYCODONE-ACETAMINOPHEN 5-325 MG PO TABS
1.0000 | ORAL_TABLET | ORAL | Status: DC | PRN
  Administered 2013-07-25 – 2013-07-26 (×3): 1 via ORAL
  Filled 2013-07-25 (×2): qty 1

## 2013-07-25 MED ORDER — ZOLPIDEM TARTRATE 5 MG PO TABS: 5.0000 mg | ORAL_TABLET | Freq: Every evening | ORAL | Status: DC | PRN

## 2013-07-25 MED ORDER — TETANUS-DIPHTH-ACELL PERTUSSIS 5-2.5-18.5 LF-MCG/0.5 IM SUSP: 0.5000 mL | Freq: Once | INTRAMUSCULAR | Status: DC

## 2013-07-25 MED ORDER — PRENATAL MULTIVITAMIN CH
1.0000 | ORAL_TABLET | Freq: Every day | ORAL | Status: DC
  Administered 2013-07-27: 1 via ORAL
  Filled 2013-07-25: qty 1

## 2013-07-25 MED ORDER — DIPHENHYDRAMINE HCL 25 MG PO CAPS: 25.0000 mg | ORAL_CAPSULE | Freq: Four times a day (QID) | ORAL | Status: DC | PRN

## 2013-07-25 MED ORDER — BENZOCAINE-MENTHOL 20-0.5 % EX AERO: 1.0000 "application " | INHALATION_SPRAY | CUTANEOUS | Status: DC | PRN

## 2013-07-25 MED ORDER — SENNOSIDES-DOCUSATE SODIUM 8.6-50 MG PO TABS
2.0000 | ORAL_TABLET | ORAL | Status: DC
  Administered 2013-07-26 (×2): 2 via ORAL
  Filled 2013-07-25 (×2): qty 2

## 2013-07-25 MED ORDER — WITCH HAZEL-GLYCERIN EX PADS: 1.0000 "application " | MEDICATED_PAD | CUTANEOUS | Status: DC | PRN

## 2013-07-25 MED ORDER — ONDANSETRON HCL 4 MG/2ML IJ SOLN: 4.0000 mg | INTRAMUSCULAR | Status: DC | PRN

## 2013-07-25 MED ORDER — LANOLIN HYDROUS EX OINT: TOPICAL_OINTMENT | CUTANEOUS | Status: DC | PRN

## 2013-07-25 NOTE — Progress Notes (Signed)
Told patient to call before she ambulates to the bathroom.  She stated she understands.

## 2013-07-25 NOTE — H&P (Signed)
LABOR ADMISSION HISTORY AND PHYSICAL  Pamela [name] is a 28 y.o. female [id number] with IUP at 2154w4d presenting for NSVD. Patient started having contractions around 1:30. She had intercourse prior to this episode. She felt the need to have a bowel movement. She delivered her child while in the bathroom. She used a Research scientist (life sciences)shoestring to tie the placenta. The cord was cut with scissors. The placenta was then thrown in the trash. EMS arrived and mother and baby were in stable condition. Her first daughter is in the care of her foster mother and her two sons are cared for by her maternal grandmother.     [facility] per pt but no records in review of chart.   Prenatal History/Complications:  Past Medical History: No past medical history on file.  Past Surgical History: No past surgical history on file.  Obstetrical History: OB History   Grav Para Term Preterm Abortions TAB SAB Ect Mult Living   1 1  1      1       Social History: History   Social History  . Marital Status: Single    Spouse Name: N/A    Number of Children: N/A  . Years of Education: N/A   Social History Main Topics  . Smoking status: Current Every Day Smoker  . Smokeless tobacco: Current User  . Alcohol Use: No  . Drug Use: No  . Sexual Activity:    Other Topics Concern  . Not on file   Social History Narrative  . No narrative on file    Family History: No family history on file.  Allergies: Allergies  Allergen Reactions  . Penicillins Other (See Comments)    Unknown Childhood reaction    Prescriptions prior to admission  Medication Sig Dispense Refill  . benzocaine (ORAJEL) 10 % mucosal gel Use as directed 1 application in the mouth or throat daily as needed. For tooth pain      . clindamycin (CLEOCIN) 150 MG capsule Take 2 capsules (300 mg total) by mouth 3 (three) times daily. May dispense as 150mg  capsules  60 capsule  0  . oxyCODONE-acetaminophen (PERCOCET/ROXICET) 5-325 MG  per tablet Take 2 tablets by mouth every 4 (four) hours as needed for pain.  10 tablet  0     Review of Systems   All systems reviewed and negative except as stated in HPI  Blood pressure 138/101, pulse 91, resp. rate 18, unknown if currently breastfeeding. General appearance: alert, cooperative and mild distress Abdomen: soft, non-tender; bowel sounds normal Extremities: Homans sign is negative, no sign of DVT GU: No lacerations or abrasions.  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 Current 1/2 PPD smoker  Significant Maternal Medications:  none Significant Maternal Lab Results: Lab values include: Group B Strep positive   No results found for this or any previous visit (from the past 24 hour(s)).  Assessment: Pamela [name] is a 28 y.o. [id number] at 6654w4d here for NSVD.   #Lack PNC: Currently living in a hotel. 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  - Prenatal labs  - UDS  - SW   #Elevated BP: upon delivery blood pressor was elevated. Currently asymptomatic.  - pr/cr urine  - CMP  - CBC   #Labor: Delivery at home  #Pain: N/a  #FWB: APGAR assigned by EMS to be 8,9.  #ID:  GBS unknown, Hx of GBS pos, no treatment prior to delivery  #MOF: bottle  #MOC:depo but considering nexplanon  #Circ:  Female   Myra RudeSchmitz, Jeremy E 07/25/2013, 5:14 PM   I have seen and examined this patient and agree with above documentation in the resident's note.   Rulon AbideKeli Philomina Leon, M.D. [facility] 07/25/2013 7:12 PM

## 2013-07-26 LAB — RPR

## 2013-07-26 LAB — GC/CHLAMYDIA PROBE AMP
CT Probe RNA: NEGATIVE
GC Probe RNA: NEGATIVE

## 2013-07-26 LAB — HIV ANTIBODY (ROUTINE TESTING W REFLEX): HIV 1&2 Ab, 4th Generation: NONREACTIVE

## 2013-07-26 LAB — HEPATITIS B SURFACE ANTIGEN: Hepatitis B Surface Ag: NEGATIVE

## 2013-07-26 NOTE — Progress Notes (Signed)
Clinical Social Work Department PSYCHOSOCIAL ASSESSMENT - MATERNAL/CHILD 07/26/2013  Patient:  Pamela [name],Pamela [name]  Account Number:  [id number]  Admit Date:  07/25/2013  Childs Name:   Pamela [name]    Clinical Social Worker:  COLLEEN SHAW, LCSW   Date/Time:  07/26/2013 10:35 AM  Date Referred:  07/26/2013   Referral source  CN  Physician     Referred reason  Substance Abuse   Other referral source:    I:  FAMILY / HOME ENVIRONMENT Child's legal guardian:  PARENT  Guardian - Name Guardian - Age Guardian - Address  Pamela [name] [address] in [place]"  Pamela [name]  unsure   Other household support members/support persons Other support:   MOB states she has a good friend, Pamela [name], who she refers to as the baby's "God Dad" who is her main support peson.  FOB is involved, but MOB states they are not in a relationship.  MOB also states her foster mother is a good support person.    II  PSYCHOSOCIAL DATA Information Source:  Patient Interview  Financial and Community Resources Employment:   Financial resources:  Medicaid If Medicaid - County:  [place]  School / Grade:   Maternity Care Coordinator / Child Services Coordination / Early Interventions:  Cultural issues impacting care:   nNone stated    III  STRENGTHS Strengths  Other - See comment  Supportive family/friends   Strength comment:  Patient has a good level of awareness of her situation. She understands that she is not in a place in her life to care for an infant, but wants to be given a chance to work a plan with Child Protective Services.  MOB is also willing to go to substance abuse treatment.   IV  RISK FACTORS AND CURRENT PROBLEMS Current Problem:  YES   Risk Factor & Current Problem Patient Issue Family Issue Risk Factor / Current Problem Comment  Substance Abuse Y N MOB-Cocaine use   N N     V  SOCIAL WORK ASSESSMENT  CSW met with MOB in her first floor  room/143 to complete assessment due to substance use.  When CSW asked to come in, MOB replied, "depends on who you are."  CSW explained and MOB asked FOB, who was sitting in bed with her, to leave.  She then promised him that she would not eat all the food.  CSW offered him a meal ticket if he was hungry so MOB could eat all her food, but he made no response.  He left the room willingly.  CSW asked MOB how she is doing and she stated, "not good, really."  CSW asked her to elaborate.  She asked CSW to keep her voice down as she talked in a whisper about baby being born in her hotel room yesterday.  She states she has 3 other children, and she knows she can'[name] take baby home from the hospital, but she has a family who can and that she wants to have a chance to parent this baby.  CSW asked her about her drug use and she states she had been doing well and not using Cocaine for some time (CSW is unsure what period of time she meant as she could not define this) and then she "relapsed during the pregnancy," and knew she was "going to regret it."  She states her last use was 5-6 days ago.  She reports that she snorts Cocaine and denies any   other drug use.  She states she started using when she was 22 and states she is willing to go to tx.  MOB reports that she grew up in Foster Care and that her foster mother raised her 10 year old daughter.  She clarified that CPS was not involved in this plan, but that MOB was 16 when this child was born and she asked her foster mother to keep the baby and she agreed.  MOB states she has two sons, ages 5 and 3, who were adopted from the hospital.  She states her mother, Pamela [name], tricked her into giving these babies up for adoption and she wishes she wouldn'[name] have.  It is clear that MOB has anger towards her mother.  MOB states her friend/baby's Godfather, Pamela [name], and his parents will take the baby home and she wants to know how quickly this can take place.  CSW explained that  since baby's UDS was positive for Cocaine, CSW must make a report to CPS today and a worker will come to the hospital to talk with her.  CSW explained that CPS will make a plan with her, and that may involve having a meeting at the hospital.  CSW asked MOB the names of Pamela' parents and she knows his father is also Pamela, but she did not know his mother's name.  She called Pamela to ask and reported to CSW that her name is Pamela [name].  They live at [address] [place], [place] [address], which is the address listed for MOB.  She states this is the address she uses for mail.  CSW asked if there is anyone else she would like to have considered as an option for placement.  She states the [facility] will definitely "check out," but that her foster mother, Pamela [name], whom she says is still a foster mother, would also be willing.  MOB claims FOB does not know about the drug use and she does not want anyone to discuss it with him.  CSW encouraged her to tell him since CPS will be involved and will need to talk with him as he has been identified as the father.  She states he is not on the birth certificate because his ID is expired and asked if this would still be the case.  CSW instructed her to talk with her CPS worker about her concerns.  MOB states understanding.  CSW made report and case was assigned to Pamela [name].  CSW will continue to follow through discharge.   VI SOCIAL WORK PLAN Social Work Plan  Patient/Family Education  Psychosocial Support/Ongoing Assessment of Needs  Child Protective Services Report   Type of pt/family education:   Hospital drug screen policy/mandated report to CPS  Ongoing support available from CSW while she and baby are in the hospital  Importance of substance abuse treatment   If child protective services report - county:  [place] If child protective services report - date:  07/26/2013 Information/referral to community resources comment:   Other social work plan:    CSW will collaborate with CPS to get MOB in to substance abuse treatment.  CSW will monitor MDS result    

## 2013-07-26 NOTE — H&P (Signed)
Attestation of Attending Supervision of Advanced Practitioner (CNM/NP): Evaluation and management procedures were performed by the Advanced Practitioner under my supervision and collaboration.  I have reviewed the Advanced Practitioner's note and chart, and I agree with the management and plan.  CONSTANT,PEGGY 07/26/2013 11:08 AM   

## 2013-07-26 NOTE — Progress Notes (Signed)
Ur chart review completed.  

## 2013-07-26 NOTE — Progress Notes (Signed)
Lisa Joyce/CPS worker met with MOB in her hospital room.  A Team Decision Meeting is scheduled for Thursday at 11am.  Classroom to be determined.  CSW will attend.  Please do not discharge until CPS plan is in place. 

## 2013-07-26 NOTE — Progress Notes (Signed)
Patient speaking to DSS rep in room.  Patient was calm and cooperative.

## 2013-07-26 NOTE — Progress Notes (Signed)
Patient stated that she had "failed her drug test", that's what a doctor told her.  She stated that it had to have been "through my pores".  She questioned if the baby was okay, I told the patient that the baby had a drug test as well, and was positive.  She became quiet, then asked if they were going to take her baby from her.  I explained that a Child psychotherapistsocial worker would be in to speak to her today and they would discuss it further. Patient asked if we would please not discuss her situation with anyone else in the room; father of the baby has been at the bedside.  I spoke to the pediatrician and social worker and told them of the patient's request.

## 2013-07-26 NOTE — Progress Notes (Signed)
Psychosocial assessment completed.  Report made to Child Protective Services.  Full documentation to follow.

## 2013-07-26 NOTE — Progress Notes (Signed)
Post Partum Day 1 Subjective: up ad lib, voiding, tolerating PO and + flatus Pt began feeling pain in lower abdomen. No other complaints. Pt had a home delivery and no PNCare.  Objective: Blood pressure 129/76, pulse 87, temperature 97.7 F (36.5 C), temperature source Oral, resp. rate 16, height 5\' 4"  (1.626 m), weight 53.978 kg (119 lb), SpO2 100.00%, unknown if currently breastfeeding.  Physical Exam:  General: alert, cooperative and no distress Lochia: appropriate Uterine Fundus: firm Incision: N/A DVT Evaluation: No evidence of DVT seen on physical exam. No cords or calf tenderness. No significant calf/ankle edema.   Recent Labs  07/25/13 1745  HGB 10.7*  HCT 31.7*    Assessment/Plan: Social work consult for +UDS for cocaine Bottle feeding MOC: Depo provera    LOS: 1 day   Bing PlumeGervasi, Kristin E 07/26/2013, 7:18 AM   I have seen and examined this patient and agree with above documentation in the PA student's note.   Rulon AbideKeli Ovide Dusek, M.D. [facility] 07/26/2013 8:58 AM

## 2013-07-27 MED ORDER — IBUPROFEN 600 MG PO TABS: 600.0000 mg | ORAL_TABLET | Freq: Four times a day (QID) | ORAL | Status: DC

## 2013-07-27 MED ORDER — MEDROXYPROGESTERONE ACETATE 150 MG/ML IM SUSP: 150.0000 mg | Freq: Once | INTRAMUSCULAR | Status: DC

## 2013-07-27 NOTE — Discharge Summary (Signed)
Obstetric Discharge Summary Reason for Admission: s/p delivery at home  Prenatal Procedures: none Intrapartum Procedures: spontaneous vaginal delivery Postpartum Procedures: none Complications-Operative and Postpartum: none Hemoglobin  Date Value Ref Range Status  07/25/2013 10.7* 12.0 - 15.0 g/dL Final     HCT  Date Value Ref Range Status  07/25/2013 31.7* 36.0 - 46.0 % Final    Physical Exam:  General: alert, cooperative and no distress Lochia: appropriate Uterine Fundus: firm Incision: na DVT Evaluation: No cords or calf tenderness. No significant calf/ankle edema.  Discharge Diagnoses: Term Pregnancy-delivered  Discharge Information: Date: 07/27/2013 Activity: pelvic rest Diet: routine Medications: PNV and Ibuprofen Condition: stable Instructions: refer to practice specific booklet Discharge to: home Follow-up Information   [facility] Schedule an appointment as soon as possible for a visit in 4 weeks.   Specialty:  Obstetrics and Gynecology   Contact information:   [address] [place] [id number] [phone]      Newborn Data: Live born female  Birth Weight: 6 lb 11 oz (3033 g) APGAR: ,   Home with mother.  Pt presented after delivering at home. Per LMP she was preterm but based on baby's weight and overall appearance, likely term. Her UDS was pos for cocaine adn SW was consulted.  A family meeting was called on hospital day 2 in order to determine discharge planning.  CPS took custody of the child based on this meeting and mother was discharged. She was given depo prior to dischrage.   BECK, KELI L 07/27/2013, 7:47 AM

## 2013-07-27 NOTE — Progress Notes (Signed)
CSW attended Team Decision Meeting with Child Protective Services, where is was determined that CPS will file a petition for custody of infant and place in foster care.  Foster parents are Garry and Janet Martin who can be reached at [phone].  MOB understands that she can no longer stay in the hospital now that CPS has taken custody of the baby (all visits from this time forward must be supervised by a DHHS representative) and foster parents are unable to stay with baby in the hospital since they have two other children at home.  Baby will become a nursery patient.  Foster parents will bring car seat and outfit tomorrow for discharge.  Pediatric follow up will be at [facility].  Foster Care Social Worker is Cheyenne Paylor/[phone]. 

## 2013-07-27 NOTE — Progress Notes (Signed)
Pt came back from TDM and got baby from the nursery.  This nurse came in to room with her. She fed the baby and foster parents came into room.  This nurse left the room.  The foster parents left and the baby was taken to nursery and mom became very upset stating that "No one told me I couldn't be with my baby".  She stated "she was leaving right now".  I was able to print her discharge paperwork and had her sign them but she would not stay and listen to any further instructions on discharge.  The pt very abruptly left.

## 2013-07-27 NOTE — Discharge Instructions (Signed)
Vaginal Delivery °Care After °Refer to this sheet in the next few weeks. These discharge instructions provide you with information on caring for yourself after delivery. Your caregiver may also give you specific instructions. Your treatment has been planned according to the most current medical practices available, but problems sometimes occur. Call your caregiver if you have any problems or questions after you go home. °HOME CARE INSTRUCTIONS °· Take over-the-counter or prescription medicines only as directed by your caregiver or pharmacist. °· Do not drink alcohol, especially if you are breastfeeding or taking medicine to relieve pain. °· Do not chew or smoke tobacco. °· Do not use illegal drugs. °· Continue to use good perineal care. Good perineal care includes: °¨ Wiping your perineum from front to back. °¨ Keeping your perineum clean. °· Do not use tampons or douche until your caregiver says it is okay. °· Shower, wash your hair, and take tub baths as directed by your caregiver. °· Wear a well-fitting bra that provides breast support. °· Eat healthy foods. °· Drink enough fluids to keep your urine clear or pale yellow. °· Eat high-fiber foods such as whole grain cereals and breads, brown rice, beans, and fresh fruits and vegetables every day. These foods may help prevent or relieve constipation. °· Follow your cargiver's recommendations regarding resumption of activities such as climbing stairs, driving, lifting, exercising, or traveling. °· Talk to your caregiver about resuming sexual activities. Resumption of sexual activities is dependent upon your risk of infection, your rate of healing, and your comfort and desire to resume sexual activity. °· Try to have someone help you with your household activities and your newborn for at least a few days after you leave the hospital. °· Rest as much as possible. Try to rest or take a nap when your newborn is sleeping. °· Increase your activities gradually. °· Keep all  of your scheduled postpartum appointments. It is very important to keep your scheduled follow-up appointments. At these appointments, your caregiver will be checking to make sure that you are healing physically and emotionally. °SEEK MEDICAL CARE IF:  °· You are passing large clots from your vagina. Save any clots to show your caregiver. °· You have a foul smelling discharge from your vagina. °· You have trouble urinating. °· You are urinating frequently. °· You have pain when you urinate. °· You have a change in your bowel movements. °· You have increasing redness, pain, or swelling near your vaginal incision (episiotomy) or vaginal tear. °· You have pus draining from your episiotomy or vaginal tear. °· Your episiotomy or vaginal tear is separating. °· You have painful, hard, or reddened breasts. °· You have a severe headache. °· You have blurred vision or see spots. °· You feel sad or depressed. °· You have thoughts of hurting yourself or your newborn. °· You have questions about your care, the care of your newborn, or medicines. °· You are dizzy or lightheaded. °· You have a rash. °· You have nausea or vomiting. °· You were breastfeeding and have not had a menstrual period within 12 weeks after you stopped breastfeeding. °· You are not breastfeeding and have not had a menstrual period by the 12th week after delivery. °· You have a fever. °SEEK IMMEDIATE MEDICAL CARE IF:  °· You have persistent pain. °· You have chest pain. °· You have shortness of breath. °· You faint. °· You have leg pain. °· You have stomach pain. °· Your vaginal bleeding saturates two or more sanitary pads   in 1 hour. °MAKE SURE YOU:  °· Understand these instructions. °· Will watch your condition. °· Will get help right away if you are not doing well or get worse. ° ° °Document Released: 01/17/2000 Document Revised: 10/14/2011 Document Reviewed: 09/16/2011 °ExitCare® Patient Information ©2015 [employer]. This information is not intended to  replace advice given to you by your health care provider. Make sure you discuss any questions you have with your health care provider. ° °

## 2013-11-17 ENCOUNTER — Other Ambulatory Visit: Payer: Self-pay

## 2013-12-04 ENCOUNTER — Encounter (HOSPITAL_COMMUNITY): Payer: Self-pay | Admitting: *Deleted

## 2014-02-02 NOTE — L&D Delivery Note (Signed)
Patient is 29 y.o. [id number] Unknown EDD admitted in active labor, hx unknown. Estimated GA 30-32wks.   Delivery Note At 9:00 PM a viable female was delivered via Vaginal, Spontaneous Delivery (Presentation: OP).  APGAR: 5, 7; weight 3 lb 9.9 oz (1640 g).   Placenta status: Intact, Spontaneous.  Cord: 3 vessels with the following complications: None.   Anesthesia: None  Episiotomy: None Lacerations: None Est. Blood Loss (mL): 75  Mom to postpartum.  Baby to NICU.  Upon arrival patient was 9cm and feeling the urge to push. She was a direct admit from the MAU. Patient progressed rapidly. She pushed with fair maternal effort to deliver a baby girl. NICU was present at delivery due to preterm delivery with no prenatal care or labs.  Baby delivered without difficulty. Precipitous delivery. Cord clamped and cut and newborn given to NICU team. Placenta delivered without complication, intact with 3V cord. Vaginal canal and perineum was inspected with no lacerations. Pitocin was started and uterus massaged until bleeding slowed. Counts of sharps, instruments, and lap pads were all correct.   Caryl AdaJazma Phelps, DO 07/03/2014, 9:41 PM PGY-1, [facility]  I was present for the entire delivery of baby and placenta and inspection of perineum and agree with above.  Placenta to: Path Feeding: Bottle Circ: NA Contraception: Mirena SW consult  [place] Vickii Volland, [place] 07/03/2014 11:24 PM

## 2014-07-03 ENCOUNTER — Inpatient Hospital Stay (HOSPITAL_COMMUNITY)
Admission: AD | Admit: 2014-07-03 | Discharge: 2014-07-04 | DRG: 775 | Disposition: A | Discharge status: Home / Self Care | Payer: Medicaid Other | Source: Ambulatory Visit | Attending: Family Medicine | Admitting: Family Medicine

## 2014-07-03 ENCOUNTER — Encounter (HOSPITAL_COMMUNITY): Payer: Self-pay

## 2014-07-03 DIAGNOSIS — IMO0001 Reserved for inherently not codable concepts without codable children: Secondary | ICD-10-CM

## 2014-07-03 DIAGNOSIS — Z3A30 30 weeks gestation of pregnancy: Secondary | ICD-10-CM | POA: Diagnosis present

## 2014-07-03 DIAGNOSIS — F1490 Cocaine use, unspecified, uncomplicated: Secondary | ICD-10-CM | POA: Diagnosis present

## 2014-07-03 DIAGNOSIS — F17210 Nicotine dependence, cigarettes, uncomplicated: Secondary | ICD-10-CM | POA: Diagnosis present

## 2014-07-03 DIAGNOSIS — O99334 Smoking (tobacco) complicating childbirth: Secondary | ICD-10-CM | POA: Diagnosis present

## 2014-07-03 DIAGNOSIS — O42913 Preterm premature rupture of membranes, unspecified as to length of time between rupture and onset of labor, third trimester: Principal | ICD-10-CM | POA: Diagnosis present

## 2014-07-03 DIAGNOSIS — O0933 Supervision of pregnancy with insufficient antenatal care, third trimester: Secondary | ICD-10-CM

## 2014-07-03 DIAGNOSIS — F1410 Cocaine abuse, uncomplicated: Secondary | ICD-10-CM | POA: Diagnosis present

## 2014-07-03 DIAGNOSIS — O99324 Drug use complicating childbirth: Secondary | ICD-10-CM | POA: Diagnosis present

## 2014-07-03 HISTORY — DX: Acute myocardial infarction, unspecified: I21.9

## 2014-07-03 HISTORY — DX: Gestational (pregnancy-induced) hypertension without significant proteinuria, unspecified trimester: O13.9

## 2014-07-03 LAB — DIFFERENTIAL
Basophils Absolute: 0 10*3/uL (ref 0.0–0.1)
Basophils Relative: 0 % (ref 0–1)
Eosinophils Absolute: 0.4 10*3/uL (ref 0.0–0.7)
Eosinophils Relative: 2 % (ref 0–5)
Lymphocytes Relative: 22 % (ref 12–46)
Lymphs Abs: 4.9 10*3/uL — ABNORMAL HIGH (ref 0.7–4.0)
Monocytes Absolute: 1.7 10*3/uL — ABNORMAL HIGH (ref 0.1–1.0)
Monocytes Relative: 8 % (ref 3–12)
Neutro Abs: 15.6 10*3/uL — ABNORMAL HIGH (ref 1.7–7.7)
Neutrophils Relative %: 68 % (ref 43–77)

## 2014-07-03 LAB — RAPID URINE DRUG SCREEN, HOSP PERFORMED
Amphetamines: NOT DETECTED
Barbiturates: NOT DETECTED
Benzodiazepines: NOT DETECTED
Cocaine: POSITIVE — AB
Opiates: NOT DETECTED
Tetrahydrocannabinol: NOT DETECTED

## 2014-07-03 LAB — RAPID HIV SCREEN (HIV 1/2 AB+AG)
HIV 1/2 Antibodies: NONREACTIVE
HIV-1 P24 Antigen - HIV24: NONREACTIVE

## 2014-07-03 LAB — CBC
HCT: 31.9 % — ABNORMAL LOW (ref 36.0–46.0)
Hemoglobin: 11 g/dL — ABNORMAL LOW (ref 12.0–15.0)
MCH: 32.8 pg (ref 26.0–34.0)
MCHC: 34.5 g/dL (ref 30.0–36.0)
MCV: 95.2 fL (ref 78.0–100.0)
PLATELETS: 209 10*3/uL (ref 150–400)
RBC: 3.35 MIL/uL — ABNORMAL LOW (ref 3.87–5.11)
RDW: 13.8 % (ref 11.5–15.5)
WBC: 22.7 10*3/uL — ABNORMAL HIGH (ref 4.0–10.5)

## 2014-07-03 LAB — TYPE AND SCREEN
ABO/RH(D): O POS
Antibody Screen: NEGATIVE

## 2014-07-03 MED ORDER — ACETAMINOPHEN 325 MG PO TABS: 650.0000 mg | ORAL_TABLET | ORAL | Status: DC | PRN

## 2014-07-03 MED ORDER — LACTATED RINGERS IV SOLN
INTRAVENOUS | Status: DC
  Administered 2014-07-03: 21:00:00 via INTRAVENOUS

## 2014-07-03 MED ORDER — WITCH HAZEL-GLYCERIN EX PADS: 1.0000 "application " | MEDICATED_PAD | CUTANEOUS | Status: DC | PRN

## 2014-07-03 MED ORDER — LIDOCAINE HCL (PF) 1 % IJ SOLN
30.0000 mL | INTRAMUSCULAR | Status: DC | PRN
  Filled 2014-07-03: qty 30

## 2014-07-03 MED ORDER — OXYTOCIN 40 UNITS IN LACTATED RINGERS INFUSION - SIMPLE MED
62.5000 mL/h | INTRAVENOUS | Status: DC
  Administered 2014-07-03: 62.5 mL/h via INTRAVENOUS

## 2014-07-03 MED ORDER — LANOLIN HYDROUS EX OINT: TOPICAL_OINTMENT | CUTANEOUS | Status: DC | PRN

## 2014-07-03 MED ORDER — ONDANSETRON HCL 4 MG PO TABS: 4.0000 mg | ORAL_TABLET | ORAL | Status: DC | PRN

## 2014-07-03 MED ORDER — VANCOMYCIN HCL IN DEXTROSE 1-5 GM/200ML-% IV SOLN
1000.0000 mg | Freq: Two times a day (BID) | INTRAVENOUS | Status: DC
  Administered 2014-07-03: 1000 mg via INTRAVENOUS
  Filled 2014-07-03: qty 200

## 2014-07-03 MED ORDER — SIMETHICONE 80 MG PO CHEW: 80.0000 mg | CHEWABLE_TABLET | ORAL | Status: DC | PRN

## 2014-07-03 MED ORDER — TETANUS-DIPHTH-ACELL PERTUSSIS 5-2.5-18.5 LF-MCG/0.5 IM SUSP: 0.5000 mL | Freq: Once | INTRAMUSCULAR | Status: DC

## 2014-07-03 MED ORDER — LIDOCAINE HCL (PF) 1 % IJ SOLN
INTRAMUSCULAR | Status: AC
  Filled 2014-07-03: qty 30

## 2014-07-03 MED ORDER — ONDANSETRON HCL 4 MG/2ML IJ SOLN: 4.0000 mg | INTRAMUSCULAR | Status: DC | PRN

## 2014-07-03 MED ORDER — OXYCODONE-ACETAMINOPHEN 5-325 MG PO TABS: 2.0000 | ORAL_TABLET | ORAL | Status: DC | PRN

## 2014-07-03 MED ORDER — ONDANSETRON HCL 4 MG/2ML IJ SOLN: 4.0000 mg | Freq: Four times a day (QID) | INTRAMUSCULAR | Status: DC | PRN

## 2014-07-03 MED ORDER — PRENATAL MULTIVITAMIN CH
1.0000 | ORAL_TABLET | Freq: Every day | ORAL | Status: DC
  Administered 2014-07-04: 1 via ORAL
  Filled 2014-07-03: qty 1

## 2014-07-03 MED ORDER — BETAMETHASONE SOD PHOS & ACET 6 (3-3) MG/ML IJ SUSP
12.0000 mg | Freq: Once | INTRAMUSCULAR | Status: DC
  Filled 2014-07-03: qty 2

## 2014-07-03 MED ORDER — ZOLPIDEM TARTRATE 5 MG PO TABS: 5.0000 mg | ORAL_TABLET | Freq: Every evening | ORAL | Status: DC | PRN

## 2014-07-03 MED ORDER — OXYCODONE-ACETAMINOPHEN 5-325 MG PO TABS: 1.0000 | ORAL_TABLET | ORAL | Status: DC | PRN

## 2014-07-03 MED ORDER — SENNOSIDES-DOCUSATE SODIUM 8.6-50 MG PO TABS
2.0000 | ORAL_TABLET | ORAL | Status: DC
  Administered 2014-07-03: 2 via ORAL
  Filled 2014-07-03: qty 2

## 2014-07-03 MED ORDER — BENZOCAINE-MENTHOL 20-0.5 % EX AERO
1.0000 "application " | INHALATION_SPRAY | CUTANEOUS | Status: DC | PRN
  Administered 2014-07-03: 1 via TOPICAL
  Filled 2014-07-03: qty 56

## 2014-07-03 MED ORDER — OXYTOCIN 40 UNITS IN LACTATED RINGERS INFUSION - SIMPLE MED
INTRAVENOUS | Status: AC
  Filled 2014-07-03: qty 1000

## 2014-07-03 MED ORDER — LACTATED RINGERS IV SOLN: 500.0000 mL | INTRAVENOUS | Status: DC | PRN

## 2014-07-03 MED ORDER — CITRIC ACID-SODIUM CITRATE 334-500 MG/5ML PO SOLN: 30.0000 mL | ORAL | Status: DC | PRN

## 2014-07-03 MED ORDER — IBUPROFEN 600 MG PO TABS
600.0000 mg | ORAL_TABLET | Freq: Four times a day (QID) | ORAL | Status: DC
  Administered 2014-07-03 – 2014-07-04 (×3): 600 mg via ORAL
  Filled 2014-07-03 (×4): qty 1

## 2014-07-03 MED ORDER — OXYTOCIN BOLUS FROM INFUSION: 500.0000 mL | INTRAVENOUS | Status: DC

## 2014-07-03 MED ORDER — DIBUCAINE 1 % RE OINT: 1.0000 "application " | TOPICAL_OINTMENT | RECTAL | Status: DC | PRN

## 2014-07-03 MED ORDER — DIPHENHYDRAMINE HCL 25 MG PO CAPS: 25.0000 mg | ORAL_CAPSULE | Freq: Four times a day (QID) | ORAL | Status: DC | PRN

## 2014-07-03 MED ORDER — FENTANYL CITRATE (PF) 100 MCG/2ML IJ SOLN: 100.0000 ug | INTRAMUSCULAR | Status: DC | PRN

## 2014-07-03 NOTE — H&P (Signed)
LABOR ADMISSION HISTORY AND PHYSICAL  Pamela [name] is a 29 y.o. female [phone] with IUP at Unknown GA presenting in active preterm labor. Patient was a direct admit from MAU. She was found be almost complete cervical dilation. She had SROM in MAU. [facility] States that prior pregnancy(first pregnancy) when she was 29yo was complicated by preeclampsia.   Of note, patient states that she wants to put BUFA. Says she only has one of her children that live with her. Unable to get further information.   Past Medical History: Past Medical History  Diagnosis Date  . Pregnancy induced hypertension   . Preterm labor   . 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 No pertinent past surgical history.  Obstetrical History: OB History    Gravida Para Term Preterm AB TAB SAB Ectopic Multiple Living   5 4 2 2      4       Social History: History   Social History  . Marital Status: Single    Spouse Name: N/A  . Number of Children: N/A  . Years of Education: N/A   Social History Main Topics  . Smoking status: Current Every Day Smoker -- 0.50 packs/day for 12 years    Types: Cigarettes  . Smokeless tobacco: Current User  . Alcohol Use: Not on file  . Drug Use: Yes    Special: Cocaine (Denies use during pregnancy)     Comment: pt stated in past would not admit to time  . Sexual Activity: Yes    Birth Control/ Protection: None   Other Topics Concern  . None   Social History Narrative    Family History: History reviewed. No pertinent family history.  Allergies: Allergies  Allergen Reactions  . Penicillins Other (See Comments)    Unknown Childhood reaction    Prescriptions prior to admission  Medication Sig Dispense Refill Last Dose  . ibuprofen (ADVIL,MOTRIN) 600 MG tablet Take 1 tablet (600 mg total) by mouth every 6 (six) hours. (Patient not  taking: Reported on 07/03/2014) 30 tablet 0 Not Taking at Unknown time   Review of Systems  Review of Systems  Constitutional: Negative for fever.  Eyes: Negative for blurred vision.  Gastrointestinal: Positive for abdominal pain (Contractions only).  Genitourinary:       Pos LOF upton arrival to MAU. Neg for vaginal bleeding.  Neurological: Negative for headaches.   BP 134/89 mmHg  Pulse 76  Temp(Src) 98.9 F (37.2 C) (Oral)  Resp 16  Ht 5\' 4"  (1.626 m)  Wt 124 lb (56.246 kg)  BMI 21.27 kg/m2  SpO2 100% General appearance: alert, moderate distress and uncooperative Lungs: normal WOB Heart: regular rate  Abdomen: gravid at ~32wks Pelvic: 9/100/+2 Presentation: cephalic Uterine activity Frequency: Every 1-2 minutes   Prenatal labs: O Pos. No other labs available  Patient Active Problem List   Diagnosis Date Noted  . Active labor 07/03/2014   Assessment: Pamela [name] is a 29 y.o. [id number] at Unknown GA but based of fundal height 30-32wks. Here for active preterm labor with almost complete presentation. No prenatal care this pregnancy.  #Labor: Expectant NSVD management. This will likely be a precipitous delivery.   Ordering rapid prenatal labs, UDS  Order placed for IM betamethasone  #Pain: Requesting epidural but too far along for epidural. Will give IV pain medications as  needed.  #FWB: Unknown #ID: Unknown GBS. Will start IV Vanc in setting of presumed precipitous delivery. No prenatal labs and PCN allergy.  #MOF: Bottle #MOC: States she wants IUD. #Circ:  Unknown sex   CSW consult as mother wants BUFA.  Caryl Ada, DO 07/03/2014, 8:48 PM PGY-1, [facility]  I was present for the exam and agree with above. Have NICU at delivery. Notified them of pt admission.   [place], CNM 07/03/2014 11:13 PM

## 2014-07-03 NOTE — MAU Note (Signed)
PT CAME IN  WITH W/C TO RM 1  -  F[facility]- 153-  VE 8-9 CM [place], CNM.  TO RM VIA STRETCHER.

## 2014-07-04 ENCOUNTER — Encounter (HOSPITAL_COMMUNITY): Payer: Self-pay

## 2014-07-04 ENCOUNTER — Encounter (HOSPITAL_COMMUNITY): Payer: Self-pay | Admitting: *Deleted

## 2014-07-04 LAB — CBC
HCT: 28.5 % — ABNORMAL LOW (ref 36.0–46.0)
Hemoglobin: 9.8 g/dL — ABNORMAL LOW (ref 12.0–15.0)
MCH: 32.8 pg (ref 26.0–34.0)
MCHC: 34.4 g/dL (ref 30.0–36.0)
MCV: 95.3 fL (ref 78.0–100.0)
Platelets: 180 10*3/uL (ref 150–400)
RBC: 2.99 MIL/uL — ABNORMAL LOW (ref 3.87–5.11)
RDW: 13.8 % (ref 11.5–15.5)
WBC: 18.8 10*3/uL — ABNORMAL HIGH (ref 4.0–10.5)

## 2014-07-04 LAB — URINALYSIS, ROUTINE W REFLEX MICROSCOPIC
Bilirubin Urine: NEGATIVE
Glucose, UA: NEGATIVE mg/dL
KETONES UR: NEGATIVE mg/dL
NITRITE: POSITIVE — AB
PH: 6 (ref 5.0–8.0)
Protein, ur: NEGATIVE mg/dL
Urobilinogen, UA: 0.2 mg/dL (ref 0.0–1.0)

## 2014-07-04 LAB — COMPREHENSIVE METABOLIC PANEL
ALK PHOS: 105 U/L (ref 38–126)
ALT: 7 U/L — AB (ref 14–54)
AST: 14 U/L — ABNORMAL LOW (ref 15–41)
Albumin: 2.8 g/dL — ABNORMAL LOW (ref 3.5–5.0)
Anion gap: 4 — ABNORMAL LOW (ref 5–15)
CHLORIDE: 107 mmol/L (ref 101–111)
CO2: 24 mmol/L (ref 22–32)
CREATININE: 0.55 mg/dL (ref 0.44–1.00)
Calcium: 8.6 mg/dL — ABNORMAL LOW (ref 8.9–10.3)
GFR calc Af Amer: >60 mL/min (ref >60)
GFR calc non Af Amer: >60 mL/min (ref >60)
Glucose, Bld: 87 mg/dL (ref 65–99)
POTASSIUM: 3.9 mmol/L (ref 3.5–5.1)
Sodium: 135 mmol/L (ref 135–145)
Total Bilirubin: 0.4 mg/dL (ref 0.3–1.2)
Total Protein: 6.1 g/dL — ABNORMAL LOW (ref 6.5–8.1)

## 2014-07-04 LAB — PROTEIN / CREATININE RATIO, URINE
Creatinine, Urine: 167 mg/dL
Protein Creatinine Ratio: 0.14 mg/mg{Cre} (ref 0.00–0.15)
Total Protein, Urine: 24 mg/dL

## 2014-07-04 LAB — URINE MICROSCOPIC-ADD ON

## 2014-07-04 LAB — OB RESULTS CONSOLE HIV ANTIBODY (ROUTINE TESTING): HIV: NONREACTIVE

## 2014-07-04 MED ORDER — PNEUMOCOCCAL VAC POLYVALENT 25 MCG/0.5ML IJ INJ
0.5000 mL | INJECTION | INTRAMUSCULAR | Status: DC
  Filled 2014-07-04: qty 0.5

## 2014-07-04 MED ORDER — MEDROXYPROGESTERONE ACETATE 150 MG/ML IM SUSP: 150.0000 mg | Freq: Once | INTRAMUSCULAR | Status: DC

## 2014-07-04 MED ORDER — MEDROXYPROGESTERONE ACETATE 150 MG/ML IM SUSP
150.0000 mg | Freq: Once | INTRAMUSCULAR | Status: AC
  Administered 2014-07-04: 150 mg via INTRAMUSCULAR
  Filled 2014-07-04: qty 1

## 2014-07-04 NOTE — Discharge Summary (Signed)
Obstetric Discharge Summary Reason for Admission: onset of labor and preterm labor Prenatal Procedures: none Intrapartum Procedures: spontaneous vaginal delivery Postpartum Procedures: none Complications-Operative and Postpartum: none HEMOGLOBIN  Date Value Ref Range Status  07/04/2014 9.8* 12.0 - 15.0 g/dL Final   HCT  Date Value Ref Range Status  07/04/2014 28.5* 36.0 - 46.0 % Final   Hospital course: Patient admitted with advanced PTL. Unable to get BMZ or ABX on board due to precipitous delivery. Appeared to be 31-[redacted] wks along.  + drug screen for cocaine.  Baby also tested positive for cocaine. Mom opted to put baby up for adoption.  Paperwork completed.  CSW involved. Patient initially had elevated BP's with normal labs.  Postpartum blood pressures were back to normal and felt to be secondary to cocaine use.  Patient received DepoProvera prior to discharge.  She was anxious to leave the hospital and was discharged on ppd 1 prior to 24 hours post delivery.  Physical Exam:  General: alert, cooperative and appears stated age Lochia: appropriate Uterine Fundus: firm DVT Evaluation: No evidence of DVT seen on physical exam.  Discharge Diagnoses: Premature labor and substance abuse with cocaine  Discharge Information: Date: 07/04/2014 Activity: pelvic rest Diet: routine Medications: None Condition: stable Instructions: see AVS Discharge to: home Follow-up Information    [facility] In 6 weeks.   Specialty:  Obstetrics and Gynecology   Why:  pp check--please call to schedule this visit--[phone]   Contact information:   [address] [place] [id number] [phone]      Newborn Data: Live born female  Birth Weight: 3 lb 9.9 oz (1640 g) APGAR: 5, 7  NICU and is up for adoption due to continued substance use. This was arranged prior to discharge.  Dayan Kreis S 07/04/2014, 12:10 PM

## 2014-07-04 NOTE — Progress Notes (Signed)
Post Partum Day 1 Subjective: no complaints, up ad lib, voiding and tolerating PO.  Didn't have PNC due to + cocaine.  Has been using intermittently throughout pregnancy.   Objective: Blood pressure 114/75, pulse 80, temperature 98 F (36.7 C), temperature source Oral, resp. rate 18, height 5\' 4"  (1.626 m), weight 124 lb (56.246 kg), SpO2 98 %, unknown if currently breastfeeding.  Physical Exam:  General: alert, cooperative and no distress Lochia: appropriate Uterine Fundus: firm DVT Evaluation: No evidence of DVT seen on physical exam. Negative Homan's sign.   Recent Labs  07/03/14 1805 07/04/14 0553  HGB 11.0* 9.8*  HCT 31.9* 28.5*    Assessment/Plan: Plan for discharge tomorrow and Social Work consult   LOS: 1 day   Pamela [name] JEHIEL 07/04/2014, 8:05 AM

## 2014-07-04 NOTE — Progress Notes (Signed)
Discharge teaching complete. Pt understood all instructions and did not have any questions. Depo shot given. Pt ambulated out of the hospital and discharged home to family.

## 2014-07-04 NOTE — Progress Notes (Signed)
CSW met with MOB at baby's bedside to introduce myself, see how she is doing and schedule a time to meet with her in her room. MOB was pleasant and seemed eager to talk with CSW. She reports wanting to make an adoption plan with [facility] and states that she will be back to her room after a quick visit with baby and then going outside to smoke. CSW and MOB agreed to meet in MOB's room at 9:30am. CSW met with MOB who states she feels sure of her plan for adoption because she "does not want to deal with CPS with another baby." She reports that she is still in a plan with CPS with her 11 month old and cannot handle it again. CSW commends her for making the decision she feels is best for her and her family. MOB states FOB/Gregory "Tony" Slade (same FOB as 11 month old) is involved and in agreement with the plan for adoption. MOB states desire to sign adoption papers today if possible and would like to use [facility] if they are available to meet with her today. She states she used them with the adoption of one of her previous children. CSW contacted Stephanie Robinson/[facility], who reports that she can come to the hospital this morning. With MOB's consent, CSW provided information on MOB and baby to Ms. Robinson. Although MOB reports that FOB will sign relinquishment papers, he is at work today. MOB is aware that she and baby tested positive for Cocaine and does not deny her use. MOB asked if CPS will be contacted even though she is making an adoption plan. CSW informed MOB that since MOB does not have any other children living in the home, a report to CPS will not be made unless she changes her plan from adoption to parenting. CSW explained that she has seven days to change her mind after signing relinquishment papers and that the baby will remain in the hospital for more than seven days so CSW will monitor. MOB states that she was going to [facility] for treatment, but does not like this agency and states she was not getting the help she needed. She states that the staff there did however help her find inpatient treatment and that she will be going to [facility] next week when a bed is available. CSW asked if she needs any assistance from CSW in regards to substance abuse treatment and she declined. She states she does not have Medicaid. CSW contacted [facility] financial counselor to inform. 

## 2014-07-04 NOTE — Discharge Instructions (Signed)

## 2014-07-05 LAB — HEPATITIS B SURFACE ANTIGEN: HEP B S AG: NEGATIVE — AB

## 2014-07-06 LAB — RPR: RPR Ser Ql: NONREACTIVE

## 2014-07-06 LAB — RUBELLA SCREEN: RUBELLA: 1.34 {index} (ref >0.99)

## 2014-10-23 ENCOUNTER — Telehealth (HOSPITAL_COMMUNITY): Payer: Self-pay | Admitting: *Deleted

## 2014-10-23 NOTE — Telephone Encounter (Signed)
Telephoned patient at home # and not able to leave message due to mailbox being full.

## 2014-10-30 ENCOUNTER — Encounter (HOSPITAL_COMMUNITY): Payer: Self-pay | Admitting: *Deleted

## 2014-10-30 ENCOUNTER — Telehealth (HOSPITAL_COMMUNITY): Payer: Self-pay | Admitting: *Deleted

## 2014-10-30 NOTE — Telephone Encounter (Signed)
Telephoned patient at home # and left message to return call to [facility] 

## 2014-11-15 ENCOUNTER — Encounter (HOSPITAL_COMMUNITY): Payer: Self-pay

## 2014-11-15 ENCOUNTER — Ambulatory Visit (HOSPITAL_COMMUNITY)
Admission: RE | Admit: 2014-11-15 | Discharge: 2014-11-15 | Disposition: A | Discharge status: Home / Self Care | Payer: Medicaid Other | Source: Ambulatory Visit | Attending: Obstetrics and Gynecology | Admitting: Obstetrics and Gynecology

## 2014-11-15 VITALS — BP 104/62 | Temp 98.4°F | Ht 64.0 in | Wt 114.0 lb

## 2014-11-15 DIAGNOSIS — R87613 High grade squamous intraepithelial lesion on cytologic smear of cervix (HGSIL): Secondary | ICD-10-CM

## 2014-11-15 DIAGNOSIS — Z09 Encounter for follow-up examination after completed treatment for conditions other than malignant neoplasm: Secondary | ICD-10-CM | POA: Insufficient documentation

## 2014-11-15 DIAGNOSIS — Z1239 Encounter for other screening for malignant neoplasm of breast: Secondary | ICD-10-CM

## 2014-11-15 DIAGNOSIS — R896 Abnormal cytological findings in specimens from other organs, systems and tissues: Secondary | ICD-10-CM | POA: Insufficient documentation

## 2014-11-15 NOTE — Progress Notes (Signed)
Patient referred to [facility] by [facility] due to having an abnormal Pap smear 08/16/2014 that a colposcopy is recommended for follow-up.  Pap Smear: Pap smear not completed today. Last Pap smear [facility]/2014 [facility]. [facility] for a colposcopy. Appointment scheduled for Thursday, November 22, 2014 at 1345.  Per patient has no history of abnormal Pap smears prior to the most recent Pap smear. Pap smear result is scanned in EPIC under media.  Physical exam: Breasts Breasts symmetrical. No skin abnormalities bilateral breasts. No nipple retraction bilateral breasts. Patient gave birth 5 months ago and able to express breast milk. Patient denies any leaking of breast milk or clogged milk ducts. Patient is not breastfeeding and was unaware that she is still producing breast milk. Recommended to patient to try cabbage leaves to help dry up her milk. No lymphadenopathy. No lumps palpated bilateral breasts. No complaints of pain or tenderness on exam. Screening mammogram recommended at age 89 unless clinically indicated.      Pelvic/Bimanual No Pap smear completed today since last Pap smear was 08/16/2014. Pap smear not indicated per [facility] guidelines.

## 2014-11-15 NOTE — Patient Instructions (Signed)
Education materials on breast self awareness given. Explained to Pamela [name] a colposcopy is needed to follow-up for abnormal Pap smear 08/16/2014. [facility] for a colposcopy. Appointment scheduled for Thursday, November 22, 2014 at 1345. Patient aware of appointment and will be there. Let patient know she will need a screening mammogram at age 29 unless clinically indicated prior. Pamela [name] verbalized understanding.  Manasvini Whatley, Kathaleen Maserhristine Poll, RN 3:01 PM

## 2014-11-22 ENCOUNTER — Ambulatory Visit (INDEPENDENT_AMBULATORY_CARE_PROVIDER_SITE_OTHER): Payer: Self-pay | Admitting: Obstetrics and Gynecology

## 2014-11-22 ENCOUNTER — Other Ambulatory Visit (HOSPITAL_COMMUNITY)
Admission: RE | Admit: 2014-11-22 | Discharge: 2014-11-22 | Disposition: A | Discharge status: Home / Self Care | Payer: MEDICAID | Source: Ambulatory Visit | Attending: Obstetrics and Gynecology | Admitting: Obstetrics and Gynecology

## 2014-11-22 ENCOUNTER — Encounter: Payer: Self-pay | Admitting: Obstetrics and Gynecology

## 2014-11-22 VITALS — BP 120/80 | HR 80 | Ht 64.0 in | Wt 111.0 lb

## 2014-11-22 DIAGNOSIS — R87619 Unspecified abnormal cytological findings in specimens from cervix uteri: Secondary | ICD-10-CM | POA: Insufficient documentation

## 2014-11-22 DIAGNOSIS — Z01812 Encounter for preprocedural laboratory examination: Secondary | ICD-10-CM

## 2014-11-22 DIAGNOSIS — Z3202 Encounter for pregnancy test, result negative: Secondary | ICD-10-CM

## 2014-11-22 DIAGNOSIS — D069 Carcinoma in situ of cervix, unspecified: Secondary | ICD-10-CM | POA: Insufficient documentation

## 2014-11-22 DIAGNOSIS — R87613 High grade squamous intraepithelial lesion on cytologic smear of cervix (HGSIL): Secondary | ICD-10-CM

## 2014-11-22 MED ORDER — IBUPROFEN 800 MG PO TABS
800.0000 mg | ORAL_TABLET | Freq: Once | ORAL | Status: AC
  Administered 2014-11-22: 800 mg via ORAL

## 2014-11-22 NOTE — Addendum Note (Signed)
Addended by: Gita KudoLASSITER, Antoin Dargis S on: 11/22/2014 02:33 PM   Modules accepted: Orders

## 2014-11-22 NOTE — Progress Notes (Signed)
Patient ID: Pamela [name], female   DOB: 10-Apr-1985, 29 y.o.   MRN: [id number] 29 yo G5P5 with HGSIL on 08/16/2014 pap smear  Patient given informed consent, signed copy in the chart, time out was performed.  Placed in lithotomy position. Cervix viewed with speculum and colposcope after application of acetic acid.   Colposcopy adequate?  yes Acetowhite lesions?yes at 12 and 6 o'clock Punctation?no Mosaicism?  no Abnormal vasculature?  no Biopsies?yes at 12 and 6 o'clock ECC?yes  COMMENTS: Patient was given post procedure instructions.  She will return in 2 weeks for results.  Catalina AntiguaONSTANT,Elnor Renovato, MD

## 2014-11-28 ENCOUNTER — Telehealth: Payer: Self-pay | Admitting: General Practice

## 2014-11-28 NOTE — Telephone Encounter (Signed)
Per Dr Jolayne Pantheronstant, patient's colpo results are consistent with pap and she needs a LEEP. appt already scheduled for 11/10. Called patient, no answer- left message stating we are trying to reach you in regards to results and an appt, please call us back at the clinics.

## 2014-11-29 NOTE — Telephone Encounter (Signed)
Attempted to reach patient concerning test results and next scheduled appt.

## 2014-11-29 NOTE — Telephone Encounter (Signed)
Pt left new message @ 1326 stating she missed our call. I called her back and informed her of Colpo results and need for LEEP procedure. Procedure was explained briefly and pt was advised that she will see an education video about the procedure on the day of visit. Her appt is scheduled on 11/10 @ 1445. She agreed to appt and voiced understanding of all information given. .Marland Kitchen

## 2014-12-13 ENCOUNTER — Encounter: Payer: Self-pay | Admitting: Obstetrics and Gynecology

## 2015-01-07 ENCOUNTER — Emergency Department (HOSPITAL_COMMUNITY)
Admission: EM | Admit: 2015-01-07 | Discharge: 2015-01-07 | Disposition: A | Discharge status: Home / Self Care | Payer: Self-pay | Attending: Emergency Medicine | Admitting: Emergency Medicine

## 2015-01-07 ENCOUNTER — Encounter (HOSPITAL_COMMUNITY): Payer: Self-pay | Admitting: Emergency Medicine

## 2015-01-07 DIAGNOSIS — K029 Dental caries, unspecified: Secondary | ICD-10-CM | POA: Insufficient documentation

## 2015-01-07 DIAGNOSIS — I252 Old myocardial infarction: Secondary | ICD-10-CM | POA: Insufficient documentation

## 2015-01-07 DIAGNOSIS — K002 Abnormalities of size and form of teeth: Secondary | ICD-10-CM | POA: Insufficient documentation

## 2015-01-07 DIAGNOSIS — Z880 Allergy status to penicillin: Secondary | ICD-10-CM | POA: Insufficient documentation

## 2015-01-07 DIAGNOSIS — K0889 Other specified disorders of teeth and supporting structures: Secondary | ICD-10-CM | POA: Insufficient documentation

## 2015-01-07 DIAGNOSIS — Z8751 Personal history of pre-term labor: Secondary | ICD-10-CM | POA: Insufficient documentation

## 2015-01-07 DIAGNOSIS — F17210 Nicotine dependence, cigarettes, uncomplicated: Secondary | ICD-10-CM | POA: Insufficient documentation

## 2015-01-07 MED ORDER — HYDROCODONE-ACETAMINOPHEN 5-325 MG PO TABS: 2.0000 | ORAL_TABLET | ORAL | Status: DC | PRN

## 2015-01-07 MED ORDER — CLINDAMYCIN HCL 150 MG PO CAPS: 300.0000 mg | ORAL_CAPSULE | Freq: Three times a day (TID) | ORAL | Status: DC

## 2015-01-07 NOTE — ED Provider Notes (Signed)
CSN: [id number]     Arrival date & time 01/07/15  [id number] History   First MD Initiated Contact with Patient 01/07/15 1024     Chief Complaint  Patient presents with  . Dental Pain     (Consider location/radiation/quality/duration/timing/severity/associated sxs/prior Treatment) HPI   Patient is a 29 year old female who presents to the ED with complaint of dental pain, onset yesterday. Patient reports having constant aching pain to her right lower molar. She notes she has had an infection to this tooth over a year ago but states she never followed up with a dentist. She states she has been taking ibuprofen and Goody's powder at home with no relief. Endorses mild swelling to her right lower jaw. Denies fever, chills, headache, drainage, dysphasia, drooling, difficulty breathing, stridor, wheezing. Reports penicillin allergy.  Past Medical History  Diagnosis Date  . Pregnancy induced hypertension   . Preterm labor   . Myocardial infarction [facility])     pt stated age 56 had MI as a result of cocaine use   No past surgical history on file. No family history on file. Social History  Substance Use Topics  . Smoking status: Current Every Day Smoker -- 0.50 packs/day for 12 years    Types: Cigarettes  . Smokeless tobacco: Current User  . Alcohol Use: No   OB History    Gravida Para Term Preterm AB TAB SAB Ectopic Multiple Living   0 5     Review of Systems  Constitutional: Negative for fever and chills.  HENT: Positive for dental problem and facial swelling. Negative for sore throat and trouble swallowing.   Respiratory: Negative for shortness of breath, wheezing and stridor.   Neurological: Negative for headaches.      Allergies  Penicillins  Home Medications   Prior to Admission medications   Medication Sig Start Date End Date Taking? Authorizing Provider  clindamycin (CLEOCIN) 150 MG capsule Take 2 capsules (300 mg total) by mouth 3 (three) times daily. May dispense  as  capsules 01/07/15   Barrett Henle, PA-C  HYDROcodone-acetaminophen (NORCO/VICODIN) 5-325 MG tablet Take 2 tablets by mouth every 4 (four) hours as needed. 01/07/15   Barrett Henle, PA-C  ibuprofen (ADVIL,MOTRIN) 600 MG tablet Take 1 tablet (600 mg total) by mouth every 6 (six) hours. Patient not taking: Reported on 07/03/2014 07/27/13   Vale Haven, MD   BP 124/80 mmHg  Pulse 99  Temp(Src) 98.2 F (36.8 C) (Oral)  Resp 18  SpO2 100% Physical Exam  Constitutional: She is oriented to person, place, and time. She appears well-developed and well-nourished.  HENT:  Head: Normocephalic and atraumatic.  Mouth/Throat: Uvula is midline, oropharynx is clear and moist and mucous membranes are normal. No oral lesions. No trismus in the jaw. Abnormal dentition. Dental caries present. No dental abscesses, uvula swelling or lacerations. No oropharyngeal exudate, posterior oropharyngeal edema, posterior oropharyngeal erythema or tonsillar abscesses.    Mild swelling noted to right lower face/cheek.  Eyes: Conjunctivae and EOM are normal. Right eye exhibits no discharge. Left eye exhibits no discharge. No scleral icterus.  Neck: Normal range of motion. Neck supple.  Pulmonary/Chest: Effort normal.  Lymphadenopathy:    She has no cervical adenopathy.  Neurological: She is alert and oriented to person, place, and time.  Nursing note and vitals reviewed.   ED Course  Procedures (including critical care time) Labs Review Labs Reviewed - No data to display  Imaging Review No  results found. I have personally reviewed and evaluated these images and lab results as part of my medical decision-making.  Filed Vitals:   01/07/15 0935  BP: 124/80  Pulse: 99  Temp: 98.2 F (36.8 C)  Resp: 18     MDM   Final diagnoses:  Pain, dental    Patient presents with right lower molar dental pain with mild swelling. Denies fever or drainage. VSS. Exam revealed poor dentition, tooth  #30 decaying down to gumline with mild erythema and swelling noted to surrounding gingiva, no fluctuance, no drainage, no evidence of abscess. Patient refused dental block in the ED. Plan to discharge patient home with clindamycin and pain meds. Patient given resources for dental follow-up.  Evaluation does not show pathology requring ongoing emergent intervention or admission. Pt is hemodynamically stable and mentating appropriately. Discussed findings/results and plan with patient/guardian, who agrees with plan. All questions answered. Return precautions discussed and outpatient follow up given.      Satira Sarkicole Elizabeth DeckerNadeau, [place] JerseyPA-C 01/07/15 1121  Margarita Grizzleanielle Ray, MD 01/08/15 [phone]

## 2015-01-07 NOTE — ED Notes (Signed)
Attending at bedside.

## 2015-01-07 NOTE — ED Notes (Signed)
Pt c/o rt sided dental pain since yesterday. Pt states "yall gon make me wait?"  Pt explained that she will be sent back out to the lobby after triage and it was also explained that we must take people back based on acuity.  Pt states "what? Francesca OmanYall got a lot of people dying or something?"

## 2015-01-07 NOTE — ED Notes (Signed)
Bed: WA26 Expected date:  Expected time:  Means of arrival:  Comments: 

## 2015-01-07 NOTE — Discharge Instructions (Signed)
Take 2 pills of your antibiotic 3 times daily for 10 days. Take your pain medications as prescribed as needed for pain relief. You may also take 800 mg ibuprofen 3 times daily as needed for pain relief. I also recommend applying ice for 15-20 minutes to affected area 3-4 times daily as needed for pain relief. Follow-up with a dental office this week. Return to the emergency department if symptoms worsen or new onset of fever, difficulty breathing, unable to swallow resulting in drooling, worsening facial/neck swelling.  [facility] Community Service [facility] [address] [place], [place] [address] Phone [phone]  [facility] [employer] [facility] in [place], [place], exemplifies the [employer] vision to improve the health and quality of life of all [place]ians by Public house manager with a passion to care for the underserved and by leading the nation in community-based, service learning oral health education.  We are committed to offering comprehensive general dental services for adults, children and special needs patients in a safe, caring and professional setting.   Appointments: Our clinic is open Monday through Friday 8:00 a.m. until 5:00 p.m. The amount of time scheduled for an appointment depends on the patients specific needs. We ask that you keep your appointed time for care or provide 24-hour notice of all appointment changes. Parents or legal guardians must accompany minor children.   Payment for Services: Medicaid and other insurance plans are welcome. Payment for services is due when services are rendered and may be made by cash or credit card. If you have dental insurance, we will assist you with your claim submission.    Emergencies:  Emergency services will be provided Monday through Friday on a walk-in basis.  Please arrive early for  emergency services. After hours emergency services will be provided for patients of record as required.   Services:  Comprehensive General Dentistry Childrens Dentistry Oral Surgery - Extractions Root Canals Sealants and Tooth Colored Fillings Crowns and Bridges Dentures and Partial Dentures Implant Services Periodontal Services and Retail buyer 3-D/Cone Beam Imaging    Emergency Department Resource Guide 1) Find a Doctor and Pay Out of Pocket Although you won't have to find out who is covered by your insurance plan, it is a good idea to ask around and get recommendations. You will then need to call the office and see if the doctor you have chosen will accept you as a new patient and what types of options they offer for patients who are self-pay. Some doctors offer discounts or will set up payment plans for their patients who do not have insurance, but you will need to ask so you aren't surprised when you get to your appointment.  2) Contact Your Local Health Department Not all health departments have doctors that can see patients for sick visits, but many do, so it is worth a call to see if yours does. If you don't know where your local health department is, you can check in your phone book. The CDC also has a tool to help you locate your state's health department, and many state websites also have listings of all of their local health departments.  3) Find a Walk-in Clinic If your illness is not likely to be very severe or complicated, you may want to try a walk in clinic. These are popping up all over the country in pharmacies, drugstores, and shopping centers. They're usually  staffed by nurse practitioners or physician assistants that have been trained to treat common illnesses and complaints. They're usually fairly quick and inexpensive. However, if you have serious medical issues or chronic medical problems, these are probably not your best  option.  No Primary Care Doctor: - Call Health Connect at  [phone] - they can help you locate a primary care doctor that  accepts your insurance, provides certain services, etc. - Physician Referral Service- [phone]  Chronic Pain Problems: Organization         Address  Phone   Notes  [facility]  [phone]) [phone] Patients need to be referred by their primary care doctor.   Medication Assistance: Organization         Address  Phone   Notes  [facility] Medication [facility] [address] [place], [place] [address] [phone]) [phone] --Must be a resident of [facility] -- Must have NO insurance coverage whatsoever (no Medicaid/ Medicare, etc.) -- The pt. MUST have a primary care doctor that directs their care regularly and follows them in the community   MedAssist  [phone]   [employer]  [phone]    Agencies that provide inexpensive medical care: Organization         Address  Phone   Notes  [facility]  [phone]) [phone]   [facility]    [phone]   [facility] [address] [place], [place] [address] [phone]) [phone]   [facility] [address], [place] [phone]   Planned Parenthood    [phone](336) [phone]   [facility]    [phone]   [facility]  [address], [place] Phone:  [phone](336) [phone], Fax:  [phone] Hours of Operation:  9 am - 6 pm, M-F.  Also accepts Medicaid/Medicare and self-pay.  [facility]  [address],  Phone: [phone], Fax: [phone]. Hours of Operation:  8:30 am - 5:30 pm, M-F.  Also accepts Medicaid and self-pay.  [facility] [place] [address], [place] Phone: [phone](336) [phone]   [facility] [address], [place], [place] [phone], Ext. [phone] Mondays & Thursdays: 7-9 AM.  First 15  patients are seen on a first come, first serve basis.    Medicaid-accepting [facility] Providers:  Organization         Address  Phone   Notes  [facility] [address],  [phone] Also accepts self-pay patients.  [facility] [address], [place]  [phone]) [phone]   [facility] [address], [place] [phone]) [phone]   [facility] [address] [place] [address], [place] [phone](336) [phone]   Renaye RakersVeita Bland [address], [place]   [phone] Only accepts [place] Access [place] patients after they have their name applied to their card.   Self-Pay (no insurance) in [facility]:  Organization         Address  Phone   Notes  Sickle Cell Patients, [facility] [address], [place] [phone]) [phone]   [facility] [address], [place] [phone]) [phone]   [facility] [place]  [address], [place] [phone]) [phone]   [facility]/Dr. Osei-Bonsu  [address], [place] or [address], [place] [phone] Phone number for both [place] and [place] locations is the same.  [facility] [address] [place] [phone]   [facility] [address], [place] or [address] [phone]   [facility] [address], [place] [phone], phone; [phone], fax Sees patients 1st and 3rd Saturday of every month.  Must not qualify for public or private insurance (i.e. Medicaid, Medicare, [place] Health Choice, Veterans' Benefits)  Household income should be no more than 200% of the poverty level The clinic cannot treat you if you are pregnant or think you are pregnant  Sexually transmitted diseases are not treated at the clinic.    Dental  Care: Organization         Address  Phone  Notes  [facility] Department of [facility] [address] [place], [place] [phone] Accepts children up to age 31 who are enrolled in [place] or  [place] Health Choice; pregnant women with a Medicaid card; and children who have applied for Medicaid or [place] Health Choice, but were declined, whose parents can pay a reduced fee at time of service.  [facility] Department of [facility]  [address], [place] [phone] Accepts children up to age 66 who are enrolled in [place] or [place] Health Choice; pregnant women with a Medicaid card; and children who have applied for Medicaid or [place] Health Choice, but were declined, whose parents can pay a reduced fee at time of service.  [facility]  [address] [place], [place] [phone] Patients are seen by appointment only. Walk-ins are not accepted. [facility] will see patients 25 years of age and older. Monday - Tuesday (8am-5pm) Most Wednesdays (8:30-5pm) $30 per visit, cash only  [facility] Access PROGRAM  [address], [facility] [phone] Patients are seen by appointment only. Walk-ins are not accepted. [facility] will see patients 59 years of age and older. One Wednesday Evening (Monthly: Volunteer Based).  $30 per visit, cash only  [employer]  [phone] for adults; Children under age 75, call Graduate Pediatric Dentistry at [phone]. Children aged 11-14, please call [phone] to request a pediatric application.  Dental services are provided in all areas of dental care including fillings, crowns and bridges, complete and partial dentures, implants, gum treatment, root canals, and extractions. Preventive care is also provided. Treatment is provided to both adults and children. Patients are selected via a lottery and there is often a waiting list.   [facility] [address] [place]  [phone] [email or web address]   [facility] [address] [place], [place] [phone], Ext. [phone] Second and Fourth Thursday of each month, opens at 6:30 AM; Clinic ends at 9 AM.  Patients are seen on a first-come first-served basis, and a limited number are seen during each clinic.   [facility]  [address] [place], [place] [phone]   Eligibility Requirements You must have lived in [place], [place], or [place] counties for at least the last three months.   You cannot be eligible for state or federal sponsored [place], including [employer], [place], or [employer].   You generally cannot be eligible for healthcare insurance through your  employer.    How to apply: Eligibility screenings are held every Tuesday and Wednesday afternoon from 1:00 pm until 4:00 pm. You do not need an appointment for the interview!  [facility] [address], [place], [place] [phone]   [facility] Health Department  [phone]   [facility] Health Department  [phone]   [facility] Health Department  [phone]    Behavioral Health Resources in the Community: Intensive Outpatient Programs Organization         Address  Phone  Notes  [facility] [address], [place], [place] [phone]   [facility] Outpatient [address] [place], [place] [phone]   [facility]: [facility] [address] [place], [place]  [phone]   [facility] Mental Health [address]  [place], [place] [phone] or [phone]   Substance Abuse Resources Organization         Address  Phone  Notes  Al[facility] Drug Services  [phone]   [facility]  [phone]   [facility] [place]  [phone]   Floydene Flock  [phone]   Residential & Outpatient Substance Abuse Program  [phone]    Psychological Services Organization         Address  Phone  Notes  [facility] Behavioral Health  [phone]   [facility] Services  [phone]   [facility] Mental Health [address] [place] [phone] or [phone]    Mobile Crisis Teams Organization         Address  Phone  Notes  [facility], Mobile Crisis Care Unit  [phone]   [facility]  [address] [place], [place] [phone]   Doristine Locks [address] [place] [phone]    Self-Help/Support Groups Organization         Address  Phone             Notes  [facility]ental Health Assoc[facility] of [place] - variety of support groups  [phone]- I7437963 Call for more information  Narcotics Anonymous (NA), [facility] [address], [employer] [place]  2 meetings at this location   Statistician         Address  Phone  Notes  [facility] [address],    [place]  [phone]   [facility]  [address], [place] [address], [place], [place] [phone]   [facility] [address] [place], [place] [phone] Admissions: 8am-3pm M-F  [facility] [address]    [place], [place] [phone]   [facility] [address] [place], [place], [place] [phone]   [facility] [address]  [place], [place] [phone]   [facility] - Intensive Outpatient [address], [place], [place] [phone]   [facility] (Addiction Recovery [facility] [address] [place].,  [place], [place] [phone] or [phone]   [facility] (RTS) [address] [place], [place] [phone] Accepts Medicaid  [facility] [address]  [place] [phone] Substance Abuse/Addiction Treatment   [facility] Organization         Address  Phone  Notes  [facility]  [phone]   Angie Fava, PhD [address] [place], [place]   [phone] or 5141399120   [facility]   [address] [place], [place] [phone]  [facility] [address], [place], [place] [phone] Insurance/Medicaid/sponsorship through [employer] and Families [address]                                    [place], [place] [phone] Therapy/tele-psych/case  [facility] [address]   [place], [place] [phone]    Dr. Lolly Mustache  [phone]   [facility]  [employer] [facility] Dept. 1) [address], [place] 2) [address], [place] 3)  [address], [place] [phone]  9547329645   [facility] Child Abuse Hotline 224-729-1404 or [phone] (After Hours)

## 2015-01-11 ENCOUNTER — Encounter: Payer: Self-pay | Admitting: Obstetrics & Gynecology

## 2015-01-11 ENCOUNTER — Other Ambulatory Visit (HOSPITAL_COMMUNITY)
Admission: RE | Admit: 2015-01-11 | Discharge: 2015-01-11 | Disposition: A | Discharge status: Home / Self Care | Payer: Self-pay | Source: Ambulatory Visit | Attending: Obstetrics & Gynecology | Admitting: Obstetrics & Gynecology

## 2015-01-11 ENCOUNTER — Ambulatory Visit (INDEPENDENT_AMBULATORY_CARE_PROVIDER_SITE_OTHER): Payer: Self-pay | Admitting: Obstetrics & Gynecology

## 2015-01-11 VITALS — BP 116/72 | HR 88 | Temp 98.0°F | Wt 111.2 lb

## 2015-01-11 DIAGNOSIS — R87613 High grade squamous intraepithelial lesion on cytologic smear of cervix (HGSIL): Secondary | ICD-10-CM

## 2015-01-11 DIAGNOSIS — N871 Moderate cervical dysplasia: Secondary | ICD-10-CM

## 2015-01-11 DIAGNOSIS — R87619 Unspecified abnormal cytological findings in specimens from cervix uteri: Secondary | ICD-10-CM | POA: Insufficient documentation

## 2015-01-11 LAB — POCT PREGNANCY, URINE: PREG TEST UR: NEGATIVE

## 2015-01-11 NOTE — Progress Notes (Signed)
Patient ID: Pamela [name], female   DOB: [birth year], 29 y.o.   MRN: [id number]  [id number] Patient's last menstrual period was 12/12/2014 (approximate). 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  Pap smear and colposcopy reviewed.   Pap HGSIL Colpo Biopsy CIN 2 including endocervix ECC CIN2 Teflon coated speculum with smoke evacuator placed.  Cervix visualized. Paracervical block placed.  Large Fisher size LOOP used to remove cone of cervix using blend of cut and cautery on LEEP machine.  Edges/Base cauterized with Ball.  Monsel's solution used for hemostasis.  Patient tolerated procedure well.  Patient given post procedure instructions.  Follow up in 12 months for repeat pap or as needed.   Adam PhenixJames G Anais Koenen, MD 01/11/2015

## 2015-01-11 NOTE — Patient Instructions (Signed)
Loop Electrosurgical Excision Procedure, Care After Refer to this sheet in the next few weeks. These instructions provide you with information on caring for yourself after your procedure. Your caregiver may also give you more specific instructions. Your treatment has been planned according to current medical practices, but problems sometimes occur. Call your caregiver if you have any problems or questions after your procedure. HOME CARE INSTRUCTIONS   Do not use tampons, douche, or have sexual intercourse for 2 weeks or as directed by your caregiver.  Begin normal activities if you have no or minimal cramping or bleeding, unless directed otherwise by your caregiver.  Take your temperature if you feel sick. Write down your temperature on paper, and tell your caregiver if you have a fever.  Take all medicines as directed by your caregiver.  Keep all your follow-up appointments and Pap tests as directed by your caregiver. SEEK IMMEDIATE MEDICAL CARE IF:   You have bleeding that is heavier or longer than a normal menstrual cycle.  You have bleeding that is bright red.  You have blood clots.  You have a fever.  You have increasing cramps or pain not relieved by medicine.  You develop abdominal pain that does not seem to be related to the same area of earlier cramping and pain.  You are lightheaded, unusually weak, or faint.  You develop painful or bloody urination.  You develop a bad smelling vaginal discharge. MAKE SURE YOU:  Understand these instructions.  Will watch your condition.  Will get help right away if you are not doing well or get worse.   This information is not intended to replace advice given to you by your health care provider. Make sure you discuss any questions you have with your health care provider.   Document Released: 10/02/2010 Document Revised: 02/09/2014 Document Reviewed: 10/02/2010 Elsevier Interactive Patient Education 2016 Elsevier Inc.  

## 2015-01-17 ENCOUNTER — Telehealth: Payer: Self-pay | Admitting: General Practice

## 2015-01-17 NOTE — Telephone Encounter (Signed)
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 Patient needs pap with cotesting in 6 months. Called patient and informed her of results & recommendations. Patient verbalized understanding & had no questions

## 2015-08-21 ENCOUNTER — Encounter (HOSPITAL_COMMUNITY): Payer: Self-pay | Admitting: *Deleted

## 2015-11-28 ENCOUNTER — Ambulatory Visit: Payer: Self-pay | Admitting: Obstetrics and Gynecology

## 2016-02-03 NOTE — L&D Delivery Note (Signed)
Delivery Note At 6:49 PM a viable female was delivered via Vaginal, Spontaneous Delivery (Presentation: LOA).  APGAR: pending per NICU, Weight Pending. Placenta status: Delivered intact with gentle traction.  Cord: 3 vessels with the following complications: None.  Cord pH: Not Collected  Anesthesia: Epidural   Episiotomy: None Lacerations: None Suture Repair: N/A Est. Blood Loss (mL): 150  Mom to postpartum.  Baby to NICU.  Lovena NeighboursAbdoulaye Diallo, PGY-1 06/17/2016, 7:01 PM  OB FELLOW DELIVERY ATTESTATION  I was gloved and present for the delivery in its entirety, and I agree with the above resident's note.    Ernestina PennaNicholas Schenk, MD 7:57 PM

## 2016-06-13 ENCOUNTER — Inpatient Hospital Stay (HOSPITAL_COMMUNITY): Payer: Medicaid Other

## 2016-06-13 ENCOUNTER — Inpatient Hospital Stay (HOSPITAL_COMMUNITY)
Admission: AD | Admit: 2016-06-13 | Discharge: 2016-06-15 | DRG: 781 | Discharge status: Against Medical Advice | Payer: Medicaid Other | Source: Ambulatory Visit | Attending: Obstetrics & Gynecology | Admitting: Obstetrics & Gynecology

## 2016-06-13 ENCOUNTER — Encounter (HOSPITAL_COMMUNITY): Payer: Self-pay | Admitting: *Deleted

## 2016-06-13 DIAGNOSIS — O134 Gestational [pregnancy-induced] hypertension without significant proteinuria, complicating childbirth: Secondary | ICD-10-CM | POA: Diagnosis not present

## 2016-06-13 DIAGNOSIS — O0933 Supervision of pregnancy with insufficient antenatal care, third trimester: Secondary | ICD-10-CM | POA: Diagnosis not present

## 2016-06-13 DIAGNOSIS — Z86001 Personal history of in-situ neoplasm of cervix uteri: Secondary | ICD-10-CM

## 2016-06-13 DIAGNOSIS — O42919 Preterm premature rupture of membranes, unspecified as to length of time between rupture and onset of labor, unspecified trimester: Secondary | ICD-10-CM | POA: Diagnosis not present

## 2016-06-13 DIAGNOSIS — Z5321 Procedure and treatment not carried out due to patient leaving prior to being seen by health care provider: Secondary | ICD-10-CM | POA: Diagnosis present

## 2016-06-13 DIAGNOSIS — O99323 Drug use complicating pregnancy, third trimester: Secondary | ICD-10-CM | POA: Diagnosis present

## 2016-06-13 DIAGNOSIS — Z3A32 32 weeks gestation of pregnancy: Secondary | ICD-10-CM

## 2016-06-13 DIAGNOSIS — F1410 Cocaine abuse, uncomplicated: Secondary | ICD-10-CM | POA: Diagnosis present

## 2016-06-13 DIAGNOSIS — O99333 Smoking (tobacco) complicating pregnancy, third trimester: Secondary | ICD-10-CM | POA: Diagnosis present

## 2016-06-13 DIAGNOSIS — O42013 Preterm premature rupture of membranes, onset of labor within 24 hours of rupture, third trimester: Secondary | ICD-10-CM | POA: Diagnosis not present

## 2016-06-13 DIAGNOSIS — O0930 Supervision of pregnancy with insufficient antenatal care, unspecified trimester: Secondary | ICD-10-CM

## 2016-06-13 DIAGNOSIS — O4290 Premature rupture of membranes, unspecified as to length of time between rupture and onset of labor, unspecified weeks of gestation: Secondary | ICD-10-CM

## 2016-06-13 DIAGNOSIS — O99324 Drug use complicating childbirth: Secondary | ICD-10-CM | POA: Diagnosis not present

## 2016-06-13 DIAGNOSIS — F17210 Nicotine dependence, cigarettes, uncomplicated: Secondary | ICD-10-CM | POA: Diagnosis present

## 2016-06-13 DIAGNOSIS — O42913 Preterm premature rupture of membranes, unspecified as to length of time between rupture and onset of labor, third trimester: Secondary | ICD-10-CM | POA: Diagnosis present

## 2016-06-13 DIAGNOSIS — Z880 Allergy status to penicillin: Secondary | ICD-10-CM | POA: Diagnosis not present

## 2016-06-13 DIAGNOSIS — F1490 Cocaine use, unspecified, uncomplicated: Secondary | ICD-10-CM | POA: Diagnosis not present

## 2016-06-13 DIAGNOSIS — Z3A33 33 weeks gestation of pregnancy: Secondary | ICD-10-CM | POA: Diagnosis not present

## 2016-06-13 HISTORY — DX: Major depressive disorder, single episode, unspecified: F32.9

## 2016-06-13 HISTORY — DX: Gonococcal infection, unspecified: A54.9

## 2016-06-13 HISTORY — DX: Depression, unspecified: F32.A

## 2016-06-13 HISTORY — DX: Unspecified abnormal cytological findings in specimens from vagina: R87.629

## 2016-06-13 HISTORY — DX: Unspecified infectious disease: B99.9

## 2016-06-13 HISTORY — DX: Unspecified ovarian cyst, unspecified side: N83.209

## 2016-06-13 LAB — RAPID URINE DRUG SCREEN, HOSP PERFORMED
Amphetamines: NOT DETECTED
BARBITURATES: NOT DETECTED
Benzodiazepines: NOT DETECTED
Cocaine: POSITIVE — AB
Opiates: NOT DETECTED
Tetrahydrocannabinol: NOT DETECTED

## 2016-06-13 LAB — DIFFERENTIAL
Basophils Absolute: 0 10*3/uL (ref 0.0–0.1)
Basophils Relative: 0 %
Eosinophils Absolute: 0.2 10*3/uL (ref 0.0–0.7)
Eosinophils Relative: 2 %
Lymphocytes Relative: 39 %
Lymphs Abs: 3.8 10*3/uL (ref 0.7–4.0)
Monocytes Absolute: 0.4 10*3/uL (ref 0.1–1.0)
Monocytes Relative: 4 %
Neutro Abs: 5.5 10*3/uL (ref 1.7–7.7)
Neutrophils Relative %: 55 %

## 2016-06-13 LAB — CBC
HCT: 32.2 % — ABNORMAL LOW (ref 36.0–46.0)
Hemoglobin: 10.9 g/dL — ABNORMAL LOW (ref 12.0–15.0)
MCH: 32.2 pg (ref 26.0–34.0)
MCHC: 33.9 g/dL (ref 30.0–36.0)
MCV: 95.3 fL (ref 78.0–100.0)
Platelets: 194 10*3/uL (ref 150–400)
RBC: 3.38 MIL/uL — ABNORMAL LOW (ref 3.87–5.11)
RDW: 13.7 % (ref 11.5–15.5)
WBC: 9.9 10*3/uL (ref 4.0–10.5)

## 2016-06-13 LAB — TYPE AND SCREEN
ABO/RH(D): O POS
ANTIBODY SCREEN: NEGATIVE

## 2016-06-13 LAB — POCT FERN TEST

## 2016-06-13 MED ORDER — AZITHROMYCIN 250 MG PO TABS
1000.0000 mg | ORAL_TABLET | Freq: Once | ORAL | Status: AC
  Administered 2016-06-13: 1000 mg via ORAL
  Filled 2016-06-13: qty 4

## 2016-06-13 MED ORDER — BETAMETHASONE SOD PHOS & ACET 6 (3-3) MG/ML IJ SUSP
12.0000 mg | Freq: Once | INTRAMUSCULAR | Status: AC
  Administered 2016-06-14: 12 mg via INTRAMUSCULAR
  Filled 2016-06-13: qty 2

## 2016-06-13 MED ORDER — DEXTROSE 5 % IV SOLN: 500.0000 mg | INTRAVENOUS | Status: DC

## 2016-06-13 MED ORDER — CLINDAMYCIN HCL 300 MG PO CAPS
300.0000 mg | ORAL_CAPSULE | Freq: Three times a day (TID) | ORAL | Status: DC
  Filled 2016-06-13 (×3): qty 1

## 2016-06-13 MED ORDER — BETAMETHASONE SOD PHOS & ACET 6 (3-3) MG/ML IJ SUSP
12.0000 mg | Freq: Once | INTRAMUSCULAR | Status: AC
  Administered 2016-06-13: 12 mg via INTRAMUSCULAR
  Filled 2016-06-13: qty 2

## 2016-06-13 MED ORDER — GENTAMICIN SULFATE 40 MG/ML IJ SOLN
5.0000 mg/kg | INTRAVENOUS | Status: AC
  Administered 2016-06-13 – 2016-06-14 (×2): 300 mg via INTRAVENOUS
  Filled 2016-06-13 (×2): qty 7.5

## 2016-06-13 MED ORDER — CLINDAMYCIN PHOSPHATE 900 MG/50ML IV SOLN
900.0000 mg | Freq: Three times a day (TID) | INTRAVENOUS | Status: AC
  Administered 2016-06-13 – 2016-06-15 (×6): 900 mg via INTRAVENOUS
  Filled 2016-06-13 (×6): qty 50

## 2016-06-13 MED ORDER — DOCUSATE SODIUM 100 MG PO CAPS
100.0000 mg | ORAL_CAPSULE | Freq: Every day | ORAL | Status: DC
  Administered 2016-06-13 – 2016-06-14 (×2): 100 mg via ORAL
  Filled 2016-06-13 (×3): qty 1

## 2016-06-13 MED ORDER — ACETAMINOPHEN 325 MG PO TABS
650.0000 mg | ORAL_TABLET | ORAL | Status: DC | PRN
  Administered 2016-06-13: 650 mg via ORAL
  Filled 2016-06-13: qty 2

## 2016-06-13 MED ORDER — PRENATAL MULTIVITAMIN CH
1.0000 | ORAL_TABLET | Freq: Every day | ORAL | Status: DC
  Administered 2016-06-14: 1 via ORAL
  Filled 2016-06-13 (×2): qty 1

## 2016-06-13 MED ORDER — AZITHROMYCIN 250 MG PO TABS: 500.0000 mg | ORAL_TABLET | Freq: Every day | ORAL | Status: DC

## 2016-06-13 MED ORDER — LACTATED RINGERS IV SOLN
INTRAVENOUS | Status: DC
  Administered 2016-06-13 – 2016-06-14 (×2): via INTRAVENOUS

## 2016-06-13 MED ORDER — CALCIUM CARBONATE ANTACID 500 MG PO CHEW: 2.0000 | CHEWABLE_TABLET | ORAL | Status: DC | PRN

## 2016-06-13 NOTE — MAU Note (Signed)
Started leaking fluid  At 1300.  Denies any pain. No real care.  States was seen a couple times. Plans to put baby up for adoption.

## 2016-06-13 NOTE — Consult Note (Addendum)
Asked by [facility]wu to provide prenatal consultation for patient at risk for preterm delivery due to PPROM.  Mother is 30 y.o. G6 P2-3-0-5 who is now 32.[redacted] weeks EGA, with pregnancy complicated by substance abuse (admits to cocaine, denies opiates) and lack of prenatal care..  She is being treated with betamethasone, clindamycin (allergic to PCN), azithromycin and gentamicin..She does not have custody of other children and plans to have baby adopted by adoptive mother of her older son.  Her last baby Otila Kluver(Jasmine Slade, DOB 07/03/14) was preterm at 32 wks 1640 gm and was a patient in the NICU here.  Discussed usual expectations for preterm infant at [redacted] weeks gestation, including possible needs for DR resuscitation, respiratory support, and IV access  Projected possible length of stay in NICU until 37 - [redacted] wks EGA.  She is familiar with NICU due to previous child.  Since she is giving the baby up for adoption she will not be providing breast milk.  Patient was attentive, had appropriate questions, and was appreciative of my input.  Thank you for consulting Neonatology.  Total time 20 minutes (all face-to-face)  JWimmer, MD

## 2016-06-13 NOTE — MAU Provider Note (Signed)
Pamela [name] is a 31 y.o. [id number] at Unknown admitted for gross ROM   She has not had any PNC.  LMP "September or October".  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  Does not have any of her kids.  Plans BUFA for this one,to the same lady who has her 30 yo.  Fetal presentation is cephalic per bedside US  History of Present Illness: ROM at home aroun 1300. Leaking large amounts of clear fluid now.  + Fern.  Patient reports the fetal movement as active. Patient reports uterine contraction  activity as none. Patient reports  vaginal bleeding as none. Patient describes fluid per vagina as Clear.  Patient Active Problem List   Diagnosis Date Noted  . Preterm premature rupture of membranes (PPROM) with unknown onset of labor 06/13/2016  . No prenatal care in current pregnancy 06/13/2016  . 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 Depression    doing ok  . Gonorrhea   . Infection    UTI  . 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 Ovarian cyst   . Pregnancy induced hypertension   . Preterm labor   . Vaginal Pap smear, abnormal    had bx    Past Surgical History: Past Surgical History:  Procedure Laterality Date  . NO PAST SURGERIES      Obstetrical History: OB History    Gravida Para Term Preterm AB Living   6 5 2 3   5    SAB TAB Ectopic Multiple Live Births         0 5      Gynecological History: + for CIN 3 w/unclear margins 2016, pt DID NOT FOLLOW UP  Social History: Social History   Social History  . Marital status: Single    Spouse name: N/A  . Number of children: N/A  . Years of education: N/A   Social History Main Topics  . Smoking status: Current Some Day Smoker    Packs/day: 0.50    Years: 12.00    Types: Cigarettes  . Smokeless tobacco: Current User  . Alcohol use No  . Drug use: No     Comment: last was 5/7   . Sexual activity: Yes    Birth control/ protection: None   Other Topics Concern  . None   Social History Narrative  . None    Family History: Family History  Problem Relation Age of Onset  . Family history unknown: Yes    Allergies: Allergies  Allergen Reactions  . Penicillins Other (See Comments)    Unknown Childhood reaction Has patient had a PCN reaction causing immediate rash, facial/tongue/throat swelling, SOB or lightheadedness with hypotension: Unknown Has patient had a PCN reaction causing severe rash involving mucus membranes or skin necrosis: Unknown Has patient had a PCN reaction that required hospitalization No Has patient had a PCN reaction occurring within the last 10 years: No If all of the above answers are "NO", then may proceed with Cephalosporin use.     Prescriptions Prior to Admission  Medication Sig Dispense Refill Last Dose  . acetaminophen (TYLENOL) 325 MG tablet Take 650 mg by mouth every 6 (six) hours as needed for mild pain.   Past Week at Unknown time  . Prenatal Vit-Fe Fumarate-FA (PRENATAL MULTIVITAMIN) TABS tablet Take 1 tablet by mouth  daily at 12 noon.   Past Week at Unknown time    Review of Systems   Constitutional: Negative for fever and chills Eyes: Negative for visual disturbances Respiratory: Negative for shortness of breath, dyspnea Cardiovascular: Negative for chest pain or palpitations  Gastrointestinal: Negative for vomiting, diarrhea and constipation Genitourinary: Negative for dysuria and urgency Musculoskeletal: Negative for back pain, joint pain, myalgias  Neurological: Negative for dizziness and headaches    Vitals:  Blood pressure 128/83, pulse 97, temperature 98.5 F (36.9 C), temperature source Oral, resp. rate 18, height 5\' 4"  (1.626 m), weight 59.4 kg (131 lb), last menstrual period 11/02/2015, SpO2 98 %, not currently breastfeeding. Physical Examination:  General appearance - alert, well appearing, and in no  distress and oriented to person, place, and time Chest - normal resp effort Heart - normal rate and regular rhythm Pelvic - Deferred. Gross ROM, clear fluid. Fern + Abdomen: gravid and fundal height  is 32 cm Pelvic Exam:normal external genitalia, vulva, vagina, cervix, uterus and adnexa Cervix: Not evaluated. fetal presentation is cephalic. Per [place] Extremities: no edema, redness or tenderness in the calves or thighs with DTRs 2+ bilaterally Membranes:intact Fetal Monitoring:Baseline: 140 bpm, Variability: Good {> 6 bpm), Accelerations: Reactive and Decelerations: Absent   US shows EFW 32.4 today ("best EDC" in report is incorrect because LMP is inaccurate and just a guess on pt's part"  Labs:  Results for orders placed or performed during the hospital encounter of 06/13/16 (from the past 24 hour(s))  Urine rapid drug screen (hosp performed)   Collection Time: 06/13/16  2:20 PM  Result Value Ref Range   Opiates NONE DETECTED NONE DETECTED   Cocaine POSITIVE (A) NONE DETECTED   Benzodiazepines NONE DETECTED NONE DETECTED   Amphetamines NONE DETECTED NONE DETECTED   Tetrahydrocannabinol NONE DETECTED NONE DETECTED   Barbiturates NONE DETECTED NONE DETECTED  CBC   Collection Time: 06/13/16  2:21 PM  Result Value Ref Range   WBC 9.9 4.0 - 10.5 K/uL   RBC 3.38 (L) 3.87 - 5.11 MIL/uL   Hemoglobin 10.9 (L) 12.0 - 15.0 g/dL   HCT 40.9 (L) 81.1 - 91.4 %   MCV 95.3 78.0 - 100.0 fL   MCH 32.2 26.0 - 34.0 pg   MCHC 33.9 30.0 - 36.0 g/dL   RDW 78.2 95.6 - 21.3 %   Platelets 194 150 - 400 K/uL  Differential   Collection Time: 06/13/16  2:21 PM  Result Value Ref Range   Neutrophils Relative % 55 %   Neutro Abs 5.5 1.7 - 7.7 K/uL   Lymphocytes Relative 39 %   Lymphs Abs 3.8 0.7 - 4.0 K/uL   Monocytes Relative 4 %   Monocytes Absolute 0.4 0.1 - 1.0 K/uL   Eosinophils Relative 2 %   Eosinophils Absolute 0.2 0.0 - 0.7 K/uL   Basophils Relative 0 %   Basophils Absolute 0.0 0.0 - 0.1 K/uL   Type and screen [facility]   Collection Time: 06/13/16  2:30 PM  Result Value Ref Range   ABO/RH(D) O POS    Antibody Screen PENDING    Sample Expiration 06/16/2016   Fern Test   Collection Time: 06/13/16  3:35 PM  Result Value Ref Range   POCT Fern Test      Imaging Studies: No results found.  Marland Kitchen azithromycin  1,000 mg Oral Once  . [START ON 06/14/2016] betamethasone acetate-betamethasone sodium phosphate  12 mg Intramuscular Once  . [START ON 06/15/2016] clindamycin  300 mg Oral Q8H  . docusate sodium  100 mg Oral Daily  . [START ON 06/14/2016] prenatal multivitamin  1 tablet Oral Q1200   No PTA meds   ASSESSMENT: Patient Active Problem List   Diagnosis Date Noted  . Preterm premature rupture of membranes (PPROM) with unknown onset of labor 06/13/2016  . No prenatal care in current pregnancy 06/13/2016  . Severe dysplasia of cervix (CIN III) 11/22/2014   PLAN:  Admit for PPROM Latency ABX BMZ Pt does not have medicaid, so will work on that [employer]FA (already has seen a Clinical research associatelawyer) Social work and NICU consult

## 2016-06-13 NOTE — H&P (Signed)
H&P  Pamela [name] is a 31 y.o. [id number] at Unknown admitted for gross ROM   She has not had any PNC.  LMP "September or October".  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  Does not have any of her kids.  Plans BUFA for this one,to the same lady who has her 30 yo.  Fetal presentation is cephalic per bedside US  History of Present Illness: ROM at home aroun 1300. Leaking large amounts of clear fluid now.  + Fern.  Patient reports the fetal movement as active. Patient reports uterine contraction  activity as none. Patient reports  vaginal bleeding as none. Patient describes fluid per vagina as Clear.  Patient Active Problem List   Diagnosis Date Noted  . Preterm premature rupture of membranes (PPROM) with unknown onset of labor 06/13/2016  . No prenatal care in current pregnancy 06/13/2016  . 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 Depression    doing ok  . Gonorrhea   . Infection    UTI  . Myocardial infarction [facility])    pt stated age 49 had MI as a result of cocaine use  . Ovarian cyst   . Pregnancy induced hypertension   . Preterm labor   . Vaginal Pap smear, abnormal    had bx    Past Surgical History: Past Surgical History:  Procedure Laterality Date  . NO PAST SURGERIES      Obstetrical History: OB History    Gravida Para Term Preterm AB Living   6 5 2 3   5    SAB TAB Ectopic Multiple Live Births         0 5      Gynecological History: + for CIN 3 w/unclear margins 2016, pt DID NOT FOLLOW UP  Social History: Social History   Social History  . Marital status: Single    Spouse name: N/A  . Number of children: N/A  . Years of education: N/A   Social History Main Topics  . Smoking status: Current Some Day Smoker    Packs/day: 0.50    Years: 12.00    Types: Cigarettes  . Smokeless tobacco: Current User  . Alcohol use No  . Drug use: No     Comment: last  was 5/7  . Sexual activity: Yes    Birth control/ protection: None   Other Topics Concern  . None   Social History Narrative  . None    Family History: Family History  Problem Relation Age of Onset  . Family history unknown: Yes    Allergies: Allergies  Allergen Reactions  . Penicillins Other (See Comments)    Unknown Childhood reaction Has patient had a PCN reaction causing immediate rash, facial/tongue/throat swelling, SOB or lightheadedness with hypotension: Unknown Has patient had a PCN reaction causing severe rash involving mucus membranes or skin necrosis: Unknown Has patient had a PCN reaction that required hospitalization No Has patient had a PCN reaction occurring within the last 10 years: No If all of the above answers are "NO", then may proceed with Cephalosporin use.     Prescriptions Prior to Admission  Medication Sig Dispense Refill Last Dose  . acetaminophen (TYLENOL) 325 MG tablet Take 650 mg by mouth every 6 (six) hours as needed for mild pain.   Past Week at Unknown time  . Prenatal Vit-Fe Fumarate-FA (PRENATAL MULTIVITAMIN) TABS tablet Take 1 tablet  by mouth daily at 12 noon.   Past Week at Unknown time    Review of Systems   Constitutional: Negative for fever and chills Eyes: Negative for visual disturbances Respiratory: Negative for shortness of breath, dyspnea Cardiovascular: Negative for chest pain or palpitations  Gastrointestinal: Negative for vomiting, diarrhea and constipation Genitourinary: Negative for dysuria and urgency Musculoskeletal: Negative for back pain, joint pain, myalgias  Neurological: Negative for dizziness and headaches    Vitals:  Blood pressure 128/83, pulse 97, temperature 98.5 F (36.9 C), temperature source Oral, resp. rate 18, height 5\' 4"  (1.626 m), weight 59.4 kg (131 lb), last menstrual period 11/02/2015, SpO2 98 %, not currently breastfeeding. Physical Examination:  General appearance - alert, well appearing, and  in no distress and oriented to person, place, and time Chest - normal resp effort Heart - normal rate and regular rhythm Pelvic - Deferred. Gross ROM, clear fluid. Fern + Abdomen: gravid and fundal height  is 32 cm Pelvic Exam:normal external genitalia, vulva, vagina, cervix, uterus and adnexa Cervix: Not evaluated. fetal presentation is cephalic. Per US Extremities: no edema, redness or tenderness in the calves or thighs with DTRs 2+ bilaterally Membranes:intact Fetal Monitoring:Baseline: 140 bpm, Variability: Good {> 6 bpm), Accelerations: Reactive and Decelerations: Absent   US shows EFW 32.4 today ("best EDC" in report is incorrect because LMP is inaccurate and just a guess on pt's part"  Labs:  Results for orders placed or performed during the hospital encounter of 06/13/16 (from the past 24 hour(s))  Urine rapid drug screen (hosp performed)   Collection Time: 06/13/16  2:20 PM  Result Value Ref Range   Opiates NONE DETECTED NONE DETECTED   Cocaine POSITIVE (A) NONE DETECTED   Benzodiazepines NONE DETECTED NONE DETECTED   Amphetamines NONE DETECTED NONE DETECTED   Tetrahydrocannabinol NONE DETECTED NONE DETECTED   Barbiturates NONE DETECTED NONE DETECTED  CBC   Collection Time: 06/13/16  2:21 PM  Result Value Ref Range   WBC 9.9 4.0 - 10.5 K/uL   RBC 3.38 (L) 3.87 - 5.11 MIL/uL   Hemoglobin 10.9 (L) 12.0 - 15.0 g/dL   HCT 09.832.2 (L) 11.936.0 - 14.746.0 %   MCV 95.3 78.0 - 100.0 fL   MCH 32.2 26.0 - 34.0 pg   MCHC 33.9 30.0 - 36.0 g/dL   RDW 82.913.7 56.211.5 - 13.015.5 %   Platelets 194 150 - 400 K/uL  Differential   Collection Time: 06/13/16  2:21 PM  Result Value Ref Range   Neutrophils Relative % 55 %   Neutro Abs 5.5 1.7 - 7.7 K/uL   Lymphocytes Relative 39 %   Lymphs Abs 3.8 0.7 - 4.0 K/uL   Monocytes Relative 4 %   Monocytes Absolute 0.4 0.1 - 1.0 K/uL   Eosinophils Relative 2 %   Eosinophils Absolute 0.2 0.0 - 0.7 K/uL   Basophils Relative 0 %   Basophils Absolute 0.0 0.0 - 0.1  K/uL  Type and screen [facility]   Collection Time: 06/13/16  2:30 PM  Result Value Ref Range   ABO/RH(D) O POS    Antibody Screen PENDING    Sample Expiration 06/16/2016   Fern Test   Collection Time: 06/13/16  3:35 PM  Result Value Ref Range   POCT Fern Test      Imaging Studies: No results found.  Marland Kitchen. azithromycin  1,000 mg Oral Once  . [START ON 06/14/2016] betamethasone acetate-betamethasone sodium phosphate  12 mg Intramuscular Once  . [START ON  06/15/2016] clindamycin  300 mg Oral Q8H  . docusate sodium  100 mg Oral Daily  . [START ON 06/14/2016] prenatal multivitamin  1 tablet Oral Q1200   No PTA meds   ASSESSMENT: Patient Active Problem List   Diagnosis Date Noted  . Preterm premature rupture of membranes (PPROM) with unknown onset of labor 06/13/2016  . No prenatal care in current pregnancy 06/13/2016  . 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

## 2016-06-13 NOTE — Progress Notes (Signed)
Pt out to desk saying she HAS to go out to smoke, being stuck here is making her feel crazy.  Pt educated on importance of bedrest d/t SROM & being preterm.  Pt offered nicotine patch & she replied "Is that patch going to make smoke come out of my mouth?" as she headed towards the elevator.  Pt states that she has to go & wont be gone & then left the unit

## 2016-06-14 LAB — RPR: RPR Ser Ql: NONREACTIVE

## 2016-06-14 LAB — RUBELLA SCREEN: RUBELLA: 1.13 {index} (ref >0.99)

## 2016-06-14 LAB — HIV ANTIBODY (ROUTINE TESTING W REFLEX): HIV Screen 4th Generation wRfx: NONREACTIVE

## 2016-06-14 LAB — HEPATITIS B SURFACE ANTIGEN: Hepatitis B Surface Ag: NEGATIVE

## 2016-06-14 NOTE — Clinical SW OB High Risk (Signed)
Clinical Social Work Antenatal   Clinical Social Worker:  Daleen Bo Stephanne Greeley, LCSW Date/Time:  06/14/2016, 12:48 PM Gestational Age on Admission:  31 y.o. Admitting Diagnosis:  Gross ROM   Expected Delivery Date:  08/09/16  Family/Home Environment  Home Address:  [address] [place] [address]  Household Member/Support Name:  Lives alone  Relationship:  Other (Self) Other Support:  [place] [employer]    Psychosocial Data  Information Source:  Patient Interview Resources:  None reported   Employment:  unemployed   Medicaid [facility]):  Uninsured  School:  N/a   Current Grade: N/A  Homebound Arranged: No  Other Resources:   (None reported)  Cultural/Environment Issues Impacting Care:  Substance abuser (cocaine)    Strengths/Weaknesses/Factors to Consider  Concerns Related to Hospitalization: Substance use during entire pregnancy. No PNC.    Previous Pregnancies/Feelings Towards Pregnancy?  Concerns related to being/becoming a mother?:  MOB is planning private adoption. Has no custody of other children.   Social Support (FOB? Who is/will be helping with baby/other kids?): Friend Ventura Sellers   Couples Relationship (describe): No current relationship with FOB    Recent Stressful Life Events (life changes in past year?):  Unplanned current pregnancy    Prenatal Care/Education/Home Preparations: No PNC/education received    Domestic Violence (of any type):  No If Yes to Domestic Violence, Describe/Action Plan:  N/A   Substance Use During Pregnancy: Yes  Follow-up Recommendations:  Substance use counseling    Patient Advised/Response: Patient was open and wiling to receive substance use resources   Other:   N/A   Clinical Assessment/Plan:   CSW met with MOB at bedside to complete assessment for consult regarding cocaine use and adoption. Upon this writers arrival, MOB was laying in bed and appeared to be disinterested in this writers arrival. CSW  explained role and reasoning for visit. MOB noted she would be willing to talk; however, she is frustrated she is going to be stuck in the hospital. This Probation officer explained to MOB her current condition and why she has to stay in the hospital. MOB understood noting she understands why she has to stay she just does not like it. This Probation officer inquired about MOB's hx with substance use. MOB noted she has a hx of cocaine use and has been actively using throughout this while pregnancy. MOB further notes she has not had any PNC due to no insurance currently and is planning a private adoption. This Probation officer inquired who the adoptive parents were. MOB notes her older child's adoptive mother plans to adopt this child also but was unable to provide me with the name of the adoptive parent. This Probation officer inquired if an attorney is in place and aware of adoption. MOB notes there is one in place and they are scheduled to visit with her tomorrow so she can sign paperwork. CSW supported MOB in her decision for adoption. MOB was appreciative. This Probation officer inquired if MOB would like substance use resources. MOB noted she would thus, CSW provided resources. Lastly, MOB inquired about getting Medicaid coverage. This Probation officer instructed MOB to contact the Medicaid office here in [facility] tomorrow between 8AM-5PM to inquire about medicaid. CSW provided the contact information to her. MOB verbalized understanding. At this time, no other needs addressed or requested. CSW will continue to follow and assist with on-going psycho-social needs as appropriate.   Leonardo Plaia, MSW, LCSW-A Clinical Social Worker   [facility]  Office: [phone]

## 2016-06-14 NOTE — Progress Notes (Signed)
FACULTY PRACTICE ANTEPARTUM COMPREHENSIVE PROGRESS NOTE  Pamela [name] is a 31 y.o. [id number] at [redacted]w[redacted]d who is admitted for PPROM.  Estimated Date of Delivery: 08/04/16 Fetal presentation is cephalic.  Length of Stay:  1 Days. Admitted 06/13/2016  Subjective: No complaints. Patient reports good fetal movement.  She reports no uterine contractions, no bleeding and no loss of fluid per vagina.  Vitals:  Blood pressure 121/80, pulse 88, temperature 98.4 F (36.9 C), temperature source Oral, resp. rate 20, height 5\' 4"  (1.626 m), weight 131 lb (59.4 kg), last menstrual period 11/02/2015, SpO2 98 %, not currently breastfeeding. Physical Examination: CONSTITUTIONAL: Well-developed, well-nourished female in no acute distress.  HENT:  Normocephalic, atraumatic, External right and left ear normal. Oropharynx is clear and moist EYES: Conjunctivae and EOM are normal. Pupils are equal, round, and reactive to light. No scleral icterus.  NECK: Normal range of motion, supple, no masses SKIN: Skin is warm and dry. No rash noted. Not diaphoretic. No erythema. No pallor. NEUROLGIC: Alert and oriented to person, place, and time. Normal reflexes, muscle tone coordination. No cranial nerve deficit noted. PSYCHIATRIC: Normal mood and affect. Normal behavior. Normal judgment and thought content. CARDIOVASCULAR: Normal heart rate noted, regular rhythm RESPIRATORY: Effort and breath sounds normal, no problems with respiration noted MUSCULOSKELETAL: Normal range of motion. No edema and no tenderness. 2+ distal pulses. ABDOMEN: Soft, nontender, nondistended, gravid. CERVIX:    Fetal monitoring: FHR: 150 bpm, Variability: moderate, Accelerations: Present, Decelerations: Absent  Uterine activity: No contractions  Results for orders placed or performed during the hospital encounter of 06/13/16 (from the past 48 hour(s))  Urine rapid drug screen (hosp performed)     Status: Abnormal   Collection Time: 06/13/16  2:20  PM  Result Value Ref Range   Opiates NONE DETECTED NONE DETECTED   Cocaine POSITIVE (A) NONE DETECTED   Benzodiazepines NONE DETECTED NONE DETECTED   Amphetamines NONE DETECTED NONE DETECTED   Tetrahydrocannabinol NONE DETECTED NONE DETECTED   Barbiturates NONE DETECTED NONE DETECTED    Comment:        DRUG SCREEN FOR MEDICAL PURPOSES ONLY.  IF CONFIRMATION IS NEEDED FOR ANY PURPOSE, NOTIFY LAB WITHIN 5 DAYS.        LOWEST DETECTABLE LIMITS FOR URINE DRUG SCREEN Drug Class       Cutoff (ng/mL) Amphetamine      1000 Barbiturate      200 Benzodiazepine   200 Tricyclics       300 Opiates          300 Cocaine          300 THC              50   Hepatitis B surface antigen     Status: None   Collection Time: 06/13/16  2:21 PM  Result Value Ref Range   Hepatitis B Surface Ag Negative Negative    Comment: (NOTE) Called to Mr Hougnifio @ 1:43a 06/14/16 Dee l Performed At: [facility] [address] [place], [place] [id number] Mila Homer MD JY:[id number]   CBC     Status: Abnormal   Collection Time: 06/13/16  2:21 PM  Result Value Ref Range   WBC 9.9 4.0 - 10.5 K/uL   RBC 3.38 (L) 3.87 - 5.11 MIL/uL   Hemoglobin 10.9 (L) 12.0 - 15.0 g/dL   HCT 86.5 (L) 78.4 - 69.6 %   MCV 95.3 78.0 - 100.0 fL   MCH 32.2 26.0 - 34.0 pg  MCHC 33.9 30.0 - 36.0 g/dL   RDW 45.413.7 09.811.5 - 11.915.5 %   Platelets 194 150 - 400 K/uL  Differential     Status: None   Collection Time: 06/13/16  2:21 PM  Result Value Ref Range   Neutrophils Relative % 55 %   Neutro Abs 5.5 1.7 - 7.7 K/uL   Lymphocytes Relative 39 %   Lymphs Abs 3.8 0.7 - 4.0 K/uL   Monocytes Relative 4 %   Monocytes Absolute 0.4 0.1 - 1.0 K/uL   Eosinophils Relative 2 %   Eosinophils Absolute 0.2 0.0 - 0.7 K/uL   Basophils Relative 0 %   Basophils Absolute 0.0 0.0 - 0.1 K/uL  Type and screen [facility] [place]     Status: None   Collection Time: 06/13/16  2:30 PM  Result Value Ref Range   ABO/RH(D) O POS     Antibody Screen NEG    Sample Expiration 06/16/2016   Fern Test     Status: Abnormal   Collection Time: 06/13/16  3:35 PM  Result Value Ref Range   POCT Fern Test      No results found.  Current scheduled medications . betamethasone acetate-betamethasone sodium phosphate  12 mg Intramuscular Once  . [START ON 06/15/2016] clindamycin  300 mg Oral Q8H  . docusate sodium  100 mg Oral Daily  . prenatal multivitamin  1 tablet Oral Q1200    I have reviewed the patient's current medications.  ASSESSMENT: Principal Problem:   Preterm premature rupture of membranes (PPROM) with unknown onset of labor Active Problems:   No prenatal care in current pregnancy   PLAN: Continue latency antibiotics Continue betamethasone No indication for tocolysis Monitor for signs/symptoms of chorioamnionitis/placental abruption Follow up anatomy read. Continue routine antenatal care.   Jaynie CollinsUGONNA  Anthonie Lotito, MD, FACOG Attending Obstetrician & Gynecologist Faculty Practice, [facility]

## 2016-06-15 DIAGNOSIS — O42919 Preterm premature rupture of membranes, unspecified as to length of time between rupture and onset of labor, unspecified trimester: Secondary | ICD-10-CM

## 2016-06-15 LAB — GC/CHLAMYDIA PROBE AMP (~~LOC~~) NOT AT ARMC
Chlamydia: NEGATIVE
Neisseria Gonorrhea: NEGATIVE

## 2016-06-15 LAB — CULTURE, BETA STREP (GROUP B ONLY)

## 2016-06-15 LAB — AMNISURE RUPTURE OF MEMBRANE (ROM) NOT AT ARMC: AMNISURE: NEGATIVE

## 2016-06-15 NOTE — Progress Notes (Signed)
Pt ambulates off unit ama. Signs ama paper and states she will come back to the hospital if she starts to leak

## 2016-06-15 NOTE — Progress Notes (Signed)
Patient ID: Pamela [name], female   DOB: 1985-05-05, 31 y.o.   MRN: [id number] FACULTY PRACTICE ANTEPARTUM(COMPREHENSIVE) NOTE  Pamela [name] is a 31 y.o. [id number] at 2856w6d by LMP who is admitted for PROM.   Fetal presentation is cephalic. Length of Stay:  2  Days  Subjective: Less fluid vaginally Patient reports the fetal movement as active. Patient reports uterine contraction  activity as none. Patient reports  vaginal bleeding as none. Patient describes fluid per vagina as Clear.  Vitals:  Blood pressure 104/63, pulse 88, temperature 98 F (36.7 C), temperature source Oral, resp. rate 16, height 5\' 4"  (1.626 m), weight 59.4 kg (131 lb), last menstrual period 11/02/2015, SpO2 96 %, not currently breastfeeding. Physical Examination:  General appearance - alert, well appearing, and in no distress Heart - normal rate and regular rhythm Abdomen - soft, nontender, nondistended Fundal Height:  size equals dates Cervical Exam: Not evaluated.  Extremities: extremities normal, atraumatic, no cyanosis or edema and Homans sign is negative, no sign of DVT Membranes:ruptured, clear fluid  Fetal Monitoring:  Fetal Heart Rate A  Mode Doppler filed at 06/15/2016 0228  Baseline Rate (A) 139 bpm filed at 06/15/2016 0228  Variability 6-25 BPM filed at 06/14/2016 1816  Accelerations 15 x 15 filed at 06/14/2016 1816  Decelerations None filed at 06/14/2016 1816       Labs:  No results found for this or any previous visit (from the past 24 hour(s)).    Medications:  Scheduled . clindamycin  300 mg Oral Q8H  . docusate sodium  100 mg Oral Daily  . prenatal multivitamin  1 tablet Oral Q1200   I have reviewed the patient's current medications.  ASSESSMENT: Patient Active Problem List   Diagnosis Date Noted  . Preterm premature rupture of membranes (PPROM) with unknown onset of labor 06/13/2016  . No prenatal care in current pregnancy 06/13/2016  . Severe dysplasia of cervix (CIN III)  11/22/2014    PLAN: Continue to follow for s/sx PTL, infection   Scheryl DarterJames Arnold 06/15/2016,7:08 AM

## 2016-06-15 NOTE — Progress Notes (Signed)
Pt took her own IV out 

## 2016-06-17 ENCOUNTER — Inpatient Hospital Stay (HOSPITAL_COMMUNITY): Payer: Medicaid Other | Admitting: Anesthesiology

## 2016-06-17 ENCOUNTER — Encounter (HOSPITAL_COMMUNITY): Payer: Self-pay | Admitting: *Deleted

## 2016-06-17 ENCOUNTER — Inpatient Hospital Stay (HOSPITAL_COMMUNITY): Payer: Medicaid Other

## 2016-06-17 ENCOUNTER — Inpatient Hospital Stay (HOSPITAL_COMMUNITY)
Admission: AD | Admit: 2016-06-17 | Discharge: 2016-06-18 | DRG: 775 | Discharge status: Against Medical Advice | Payer: Medicaid Other | Source: Ambulatory Visit | Attending: Obstetrics and Gynecology | Admitting: Obstetrics and Gynecology

## 2016-06-17 ENCOUNTER — Encounter: Payer: Self-pay | Admitting: *Deleted

## 2016-06-17 DIAGNOSIS — Z86001 Personal history of in-situ neoplasm of cervix uteri: Secondary | ICD-10-CM | POA: Diagnosis not present

## 2016-06-17 DIAGNOSIS — F1490 Cocaine use, unspecified, uncomplicated: Secondary | ICD-10-CM | POA: Diagnosis not present

## 2016-06-17 DIAGNOSIS — O26899 Other specified pregnancy related conditions, unspecified trimester: Secondary | ICD-10-CM

## 2016-06-17 DIAGNOSIS — F1210 Cannabis abuse, uncomplicated: Secondary | ICD-10-CM | POA: Diagnosis present

## 2016-06-17 DIAGNOSIS — O99324 Drug use complicating childbirth: Secondary | ICD-10-CM | POA: Diagnosis not present

## 2016-06-17 DIAGNOSIS — O99323 Drug use complicating pregnancy, third trimester: Secondary | ICD-10-CM | POA: Diagnosis present

## 2016-06-17 DIAGNOSIS — Z880 Allergy status to penicillin: Secondary | ICD-10-CM | POA: Diagnosis not present

## 2016-06-17 DIAGNOSIS — O42013 Preterm premature rupture of membranes, onset of labor within 24 hours of rupture, third trimester: Secondary | ICD-10-CM | POA: Diagnosis not present

## 2016-06-17 DIAGNOSIS — F17210 Nicotine dependence, cigarettes, uncomplicated: Secondary | ICD-10-CM | POA: Diagnosis present

## 2016-06-17 DIAGNOSIS — Z3A33 33 weeks gestation of pregnancy: Secondary | ICD-10-CM

## 2016-06-17 DIAGNOSIS — O134 Gestational [pregnancy-induced] hypertension without significant proteinuria, complicating childbirth: Secondary | ICD-10-CM

## 2016-06-17 DIAGNOSIS — O42913 Preterm premature rupture of membranes, unspecified as to length of time between rupture and onset of labor, third trimester: Secondary | ICD-10-CM | POA: Diagnosis present

## 2016-06-17 DIAGNOSIS — O42919 Preterm premature rupture of membranes, unspecified as to length of time between rupture and onset of labor, unspecified trimester: Secondary | ICD-10-CM

## 2016-06-17 DIAGNOSIS — F1410 Cocaine abuse, uncomplicated: Secondary | ICD-10-CM | POA: Diagnosis present

## 2016-06-17 DIAGNOSIS — O99320 Drug use complicating pregnancy, unspecified trimester: Secondary | ICD-10-CM

## 2016-06-17 DIAGNOSIS — Z5321 Procedure and treatment not carried out due to patient leaving prior to being seen by health care provider: Secondary | ICD-10-CM | POA: Diagnosis present

## 2016-06-17 DIAGNOSIS — O0933 Supervision of pregnancy with insufficient antenatal care, third trimester: Secondary | ICD-10-CM

## 2016-06-17 DIAGNOSIS — R109 Unspecified abdominal pain: Secondary | ICD-10-CM

## 2016-06-17 DIAGNOSIS — Z8673 Personal history of transient ischemic attack (TIA), and cerebral infarction without residual deficits: Secondary | ICD-10-CM

## 2016-06-17 DIAGNOSIS — Z3493 Encounter for supervision of normal pregnancy, unspecified, third trimester: Secondary | ICD-10-CM | POA: Diagnosis present

## 2016-06-17 DIAGNOSIS — O99334 Smoking (tobacco) complicating childbirth: Secondary | ICD-10-CM | POA: Diagnosis present

## 2016-06-17 LAB — CBC
HCT: 31.7 % — ABNORMAL LOW (ref 36.0–46.0)
Hemoglobin: 10.9 g/dL — ABNORMAL LOW (ref 12.0–15.0)
MCH: 32.6 pg (ref 26.0–34.0)
MCHC: 34.4 g/dL (ref 30.0–36.0)
MCV: 94.9 fL (ref 78.0–100.0)
PLATELETS: 193 10*3/uL (ref 150–400)
RBC: 3.34 MIL/uL — ABNORMAL LOW (ref 3.87–5.11)
RDW: 13.9 % (ref 11.5–15.5)
WBC: 17.2 10*3/uL — ABNORMAL HIGH (ref 4.0–10.5)

## 2016-06-17 LAB — COMPREHENSIVE METABOLIC PANEL
ALBUMIN: 3.2 g/dL — AB (ref 3.5–5.0)
ALT: 11 U/L — ABNORMAL LOW (ref 14–54)
AST: 23 U/L (ref 15–41)
Alkaline Phosphatase: 236 U/L — ABNORMAL HIGH (ref 38–126)
Anion gap: 8 (ref 5–15)
BUN: 10 mg/dL (ref 6–20)
CHLORIDE: 103 mmol/L (ref 101–111)
CO2: 25 mmol/L (ref 22–32)
Calcium: 9.5 mg/dL (ref 8.9–10.3)
Creatinine, Ser: 0.5 mg/dL (ref 0.44–1.00)
GFR calc Af Amer: >60 mL/min (ref >60)
GFR calc non Af Amer: >60 mL/min (ref >60)
GLUCOSE: 69 mg/dL (ref 65–99)
POTASSIUM: 4.1 mmol/L (ref 3.5–5.1)
SODIUM: 136 mmol/L (ref 135–145)
Total Bilirubin: 0.7 mg/dL (ref 0.3–1.2)
Total Protein: 6.8 g/dL (ref 6.5–8.1)

## 2016-06-17 LAB — URINALYSIS, ROUTINE W REFLEX MICROSCOPIC
Bilirubin Urine: NEGATIVE
Glucose, UA: NEGATIVE mg/dL
Hgb urine dipstick: NEGATIVE
KETONES UR: NEGATIVE mg/dL
Nitrite: NEGATIVE
PH: 6 (ref 5.0–8.0)
PROTEIN: NEGATIVE mg/dL
Specific Gravity, Urine: 1.011 (ref 1.005–1.030)

## 2016-06-17 LAB — RAPID URINE DRUG SCREEN, HOSP PERFORMED
Amphetamines: NOT DETECTED
BENZODIAZEPINES: NOT DETECTED
Barbiturates: NOT DETECTED
COCAINE: POSITIVE — AB
OPIATES: NOT DETECTED
Tetrahydrocannabinol: NOT DETECTED

## 2016-06-17 LAB — RAPID HIV SCREEN (HIV 1/2 AB+AG)
HIV 1/2 Antibodies: NONREACTIVE
HIV-1 P24 Antigen - HIV24: NONREACTIVE

## 2016-06-17 LAB — TYPE AND SCREEN
ABO/RH(D): O POS
ANTIBODY SCREEN: NEGATIVE

## 2016-06-17 LAB — PROTEIN / CREATININE RATIO, URINE
CREATININE, URINE: 54 mg/dL
Protein Creatinine Ratio: 0.2 mg/mg{Cre} — ABNORMAL HIGH (ref 0.00–0.15)
Total Protein, Urine: 11 mg/dL

## 2016-06-17 MED ORDER — LACTATED RINGERS IV SOLN
500.0000 mL | Freq: Once | INTRAVENOUS | Status: AC
  Administered 2016-06-17: 12:00:00 via INTRAVENOUS

## 2016-06-17 MED ORDER — HYDROXYZINE HCL 50 MG/ML IM SOLN
50.0000 mg | Freq: Once | INTRAMUSCULAR | Status: AC
  Administered 2016-06-17: 50 mg via INTRAMUSCULAR
  Filled 2016-06-17: qty 1

## 2016-06-17 MED ORDER — PHENYLEPHRINE 40 MCG/ML (10ML) SYRINGE FOR IV PUSH (FOR BLOOD PRESSURE SUPPORT)
80.0000 ug | PREFILLED_SYRINGE | INTRAVENOUS | Status: DC | PRN
  Filled 2016-06-17: qty 10
  Filled 2016-06-17: qty 5

## 2016-06-17 MED ORDER — EPHEDRINE 5 MG/ML INJ
10.0000 mg | INTRAVENOUS | Status: DC | PRN
  Filled 2016-06-17: qty 2

## 2016-06-17 MED ORDER — DIBUCAINE 1 % RE OINT: 1.0000 "application " | TOPICAL_OINTMENT | RECTAL | Status: DC | PRN

## 2016-06-17 MED ORDER — CEFAZOLIN SODIUM-DEXTROSE 2-4 GM/100ML-% IV SOLN: 2.0000 g | Freq: Once | INTRAVENOUS | Status: DC

## 2016-06-17 MED ORDER — SIMETHICONE 80 MG PO CHEW: 80.0000 mg | CHEWABLE_TABLET | ORAL | Status: DC | PRN

## 2016-06-17 MED ORDER — PRENATAL MULTIVITAMIN CH
1.0000 | ORAL_TABLET | Freq: Every day | ORAL | Status: DC
  Filled 2016-06-17: qty 1

## 2016-06-17 MED ORDER — MAGNESIUM SULFATE 40 G IN LACTATED RINGERS - SIMPLE
3.0000 g/h | INTRAVENOUS | Status: DC
  Filled 2016-06-17: qty 500

## 2016-06-17 MED ORDER — COCONUT OIL OIL: 1.0000 "application " | TOPICAL_OIL | Status: DC | PRN

## 2016-06-17 MED ORDER — LABETALOL HCL 5 MG/ML IV SOLN
INTRAVENOUS | Status: AC
Start: 2016-06-17 — End: 2016-06-17
  Filled 2016-06-17: qty 4

## 2016-06-17 MED ORDER — LACTATED RINGERS IV SOLN: 500.0000 mL | INTRAVENOUS | Status: DC | PRN

## 2016-06-17 MED ORDER — PHENYLEPHRINE 40 MCG/ML (10ML) SYRINGE FOR IV PUSH (FOR BLOOD PRESSURE SUPPORT)
80.0000 ug | PREFILLED_SYRINGE | INTRAVENOUS | Status: DC | PRN
  Filled 2016-06-17: qty 5

## 2016-06-17 MED ORDER — LABETALOL HCL 5 MG/ML IV SOLN
20.0000 mg | INTRAVENOUS | Status: DC | PRN
  Administered 2016-06-17: 20 mg via INTRAVENOUS
  Filled 2016-06-17: qty 4

## 2016-06-17 MED ORDER — TERBUTALINE SULFATE 1 MG/ML IJ SOLN: 0.2500 mg | Freq: Once | INTRAMUSCULAR | Status: DC | PRN

## 2016-06-17 MED ORDER — HYDROXYZINE HCL 50 MG/ML IM SOLN: 50.0000 mg | Freq: Once | INTRAMUSCULAR | Status: DC

## 2016-06-17 MED ORDER — OXYCODONE-ACETAMINOPHEN 5-325 MG PO TABS: 1.0000 | ORAL_TABLET | ORAL | Status: DC | PRN

## 2016-06-17 MED ORDER — SENNOSIDES-DOCUSATE SODIUM 8.6-50 MG PO TABS
2.0000 | ORAL_TABLET | ORAL | Status: DC
  Administered 2016-06-17: 2 via ORAL
  Filled 2016-06-17: qty 2

## 2016-06-17 MED ORDER — PROMETHAZINE HCL 25 MG/ML IJ SOLN
12.5000 mg | Freq: Four times a day (QID) | INTRAMUSCULAR | Status: DC | PRN
  Administered 2016-06-17: 12.5 mg via INTRAVENOUS
  Filled 2016-06-17: qty 1

## 2016-06-17 MED ORDER — BUTORPHANOL TARTRATE 1 MG/ML IJ SOLN
2.0000 mg | INTRAMUSCULAR | Status: DC | PRN
  Administered 2016-06-17: 2 mg via INTRAVENOUS
  Filled 2016-06-17: qty 2

## 2016-06-17 MED ORDER — TETANUS-DIPHTH-ACELL PERTUSSIS 5-2.5-18.5 LF-MCG/0.5 IM SUSP: 0.5000 mL | Freq: Once | INTRAMUSCULAR | Status: DC

## 2016-06-17 MED ORDER — FENTANYL 2.5 MCG/ML BUPIVACAINE 1/10 % EPIDURAL INFUSION (WH - ANES)
14.0000 mL/h | INTRAMUSCULAR | Status: DC | PRN
  Administered 2016-06-17: 14 mL/h via EPIDURAL
  Filled 2016-06-17: qty 100

## 2016-06-17 MED ORDER — OXYTOCIN 40 UNITS IN LACTATED RINGERS INFUSION - SIMPLE MED
2.5000 [IU]/h | INTRAVENOUS | Status: DC
  Filled 2016-06-17: qty 1000

## 2016-06-17 MED ORDER — IBUPROFEN 600 MG PO TABS
600.0000 mg | ORAL_TABLET | Freq: Four times a day (QID) | ORAL | Status: DC
  Administered 2016-06-17 – 2016-06-18 (×4): 600 mg via ORAL
  Filled 2016-06-17 (×4): qty 1

## 2016-06-17 MED ORDER — WITCH HAZEL-GLYCERIN EX PADS: 1.0000 "application " | MEDICATED_PAD | CUTANEOUS | Status: DC | PRN

## 2016-06-17 MED ORDER — SOD CITRATE-CITRIC ACID 500-334 MG/5ML PO SOLN: 30.0000 mL | ORAL | Status: DC | PRN

## 2016-06-17 MED ORDER — MAGNESIUM SULFATE BOLUS VIA INFUSION
4.0000 g | Freq: Once | INTRAVENOUS | Status: AC
  Administered 2016-06-17: 4 g via INTRAVENOUS
  Filled 2016-06-17: qty 500

## 2016-06-17 MED ORDER — LACTATED RINGERS IV SOLN: INTRAVENOUS | Status: DC

## 2016-06-17 MED ORDER — OXYTOCIN 40 UNITS IN LACTATED RINGERS INFUSION - SIMPLE MED
1.0000 m[IU]/min | INTRAVENOUS | Status: DC
  Administered 2016-06-17: 2 m[IU]/min via INTRAVENOUS

## 2016-06-17 MED ORDER — HYDRALAZINE HCL 20 MG/ML IJ SOLN
10.0000 mg | Freq: Once | INTRAMUSCULAR | Status: AC | PRN
  Administered 2016-06-17: 10 mg via INTRAVENOUS

## 2016-06-17 MED ORDER — LIDOCAINE HCL (PF) 1 % IJ SOLN
30.0000 mL | INTRAMUSCULAR | Status: DC | PRN
  Filled 2016-06-17: qty 30

## 2016-06-17 MED ORDER — FENTANYL CITRATE (PF) 100 MCG/2ML IJ SOLN
INTRAMUSCULAR | Status: AC
  Filled 2016-06-17: qty 2

## 2016-06-17 MED ORDER — OXYCODONE-ACETAMINOPHEN 5-325 MG PO TABS: 2.0000 | ORAL_TABLET | ORAL | Status: DC | PRN

## 2016-06-17 MED ORDER — HYDRALAZINE HCL 20 MG/ML IJ SOLN
INTRAMUSCULAR | Status: AC
Start: 2016-06-17 — End: 2016-06-17
  Filled 2016-06-17: qty 1

## 2016-06-17 MED ORDER — FENTANYL CITRATE (PF) 100 MCG/2ML IJ SOLN
50.0000 ug | Freq: Once | INTRAMUSCULAR | Status: AC
  Administered 2016-06-17: 50 ug via INTRAVENOUS

## 2016-06-17 MED ORDER — ACETAMINOPHEN 325 MG PO TABS
650.0000 mg | ORAL_TABLET | ORAL | Status: DC | PRN
Start: 2016-06-17 — End: 2016-06-17

## 2016-06-17 MED ORDER — BENZOCAINE-MENTHOL 20-0.5 % EX AERO
1.0000 "application " | INHALATION_SPRAY | CUTANEOUS | Status: DC | PRN
  Administered 2016-06-17: 1 via TOPICAL
  Filled 2016-06-17: qty 56

## 2016-06-17 MED ORDER — FENTANYL CITRATE (PF) 100 MCG/2ML IJ SOLN
100.0000 ug | INTRAMUSCULAR | Status: DC | PRN
  Administered 2016-06-17: 100 ug via INTRAVENOUS
  Filled 2016-06-17: qty 2

## 2016-06-17 MED ORDER — ONDANSETRON HCL 4 MG/2ML IJ SOLN: 4.0000 mg | INTRAMUSCULAR | Status: DC | PRN

## 2016-06-17 MED ORDER — ACETAMINOPHEN 325 MG PO TABS: 650.0000 mg | ORAL_TABLET | ORAL | Status: DC | PRN

## 2016-06-17 MED ORDER — ONDANSETRON HCL 4 MG PO TABS
4.0000 mg | ORAL_TABLET | ORAL | Status: DC | PRN
Start: 2016-06-17 — End: 2016-06-18

## 2016-06-17 MED ORDER — DIPHENHYDRAMINE HCL 50 MG/ML IJ SOLN: 12.5000 mg | INTRAMUSCULAR | Status: DC | PRN

## 2016-06-17 MED ORDER — LIDOCAINE HCL (PF) 1 % IJ SOLN
INTRAMUSCULAR | Status: DC | PRN
  Administered 2016-06-17: 13 mL via EPIDURAL

## 2016-06-17 MED ORDER — CEFAZOLIN SODIUM-DEXTROSE 1-4 GM/50ML-% IV SOLN: 1.0000 g | Freq: Three times a day (TID) | INTRAVENOUS | Status: DC

## 2016-06-17 MED ORDER — DIPHENHYDRAMINE HCL 25 MG PO CAPS: 25.0000 mg | ORAL_CAPSULE | Freq: Four times a day (QID) | ORAL | Status: DC | PRN

## 2016-06-17 MED ORDER — OXYTOCIN BOLUS FROM INFUSION
500.0000 mL | Freq: Once | INTRAVENOUS | Status: AC
  Administered 2016-06-17: 500 mL via INTRAVENOUS

## 2016-06-17 MED ORDER — LACTATED RINGERS IV SOLN
INTRAVENOUS | Status: DC
  Administered 2016-06-17 (×2): via INTRAVENOUS

## 2016-06-17 MED ORDER — ONDANSETRON HCL 4 MG/2ML IJ SOLN: 4.0000 mg | Freq: Four times a day (QID) | INTRAMUSCULAR | Status: DC | PRN

## 2016-06-17 MED ORDER — ZOLPIDEM TARTRATE 5 MG PO TABS
5.0000 mg | ORAL_TABLET | Freq: Every evening | ORAL | Status: DC | PRN
Start: 2016-06-17 — End: 2016-06-18

## 2016-06-17 NOTE — H&P (Signed)
LABOR AND DELIVERY ADMISSION HISTORY AND PHYSICAL NOTE  Pamela [name] is a 31 y.o. female [phone] with IUP at [redacted]w[redacted]d by LMP and anatomy scan presenting for severe abdominal pain and contractions. She was admitted this weekend for possible PPROM, but AFI was normal and amnisure was negative. She has had no bleeding or leaking of fluid since d/c. She reprots she did use cocaine last on 5/13. She reports that she started with mild cramping this AM and then pain began to get severe around 9. She is considering BUFA.   She reports positive fetal movement. She denies leakage of fluid or vaginal bleeding.  Prenatal History/Complications:  Past Medical History: Past Medical History:  Diagnosis Date  . Depression    doing ok  . Gonorrhea   . Infection    UTI  . 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 Ovarian cyst   . Pregnancy induced hypertension   . Preterm labor   . Vaginal Pap smear, abnormal    had bx    Past Surgical History: Past Surgical History:  Procedure Laterality Date  . NO PAST SURGERIES      Obstetrical History: OB History    Gravida Para Term Preterm AB Living   6 5 2 3   5    SAB TAB Ectopic Multiple Live Births         0 5      Social History: Social History   Social History  . Marital status: Single    Spouse name: N/A  . Number of children: N/A  . Years of education: N/A   Social History Main Topics  . Smoking status: Current Some Day Smoker    Packs/day: 0.50    Years: 12.00    Types: Cigarettes  . Smokeless tobacco: Never Used  . Alcohol use No  . Drug use: No     Comment: last was 5/13  . Sexual activity: Yes    Birth control/ protection: None   Other Topics Concern  . None   Social History Narrative  . None    Family History: Family History  Problem Relation Age of Onset  . Family history unknown: Yes    Allergies: Allergies  Allergen Reactions  . Penicillins Hives     Has  patient had a PCN reaction causing immediate rash, facial/tongue/throat swelling, SOB or lightheadedness with hypotension: Unknown Has patient had a PCN reaction causing severe rash involving mucus membranes or skin necrosis: Unknown Has patient had a PCN reaction that required hospitalization No Has patient had a PCN reaction occurring within the last 10 years: No If all of the above answers are "NO", then may proceed with Cephalosporin use.     Prescriptions Prior to Admission  Medication Sig Dispense Refill Last Dose  . acetaminophen (TYLENOL) 325 MG tablet Take 650 mg by mouth every 6 (six) hours as needed for mild pain.   Past Week at Unknown time  . Prenatal Vit-Fe Fumarate-FA (PRENATAL MULTIVITAMIN) TABS tablet Take 1 tablet by mouth daily at 12 noon.   Past Week at Unknown time     Review of Systems   All systems reviewed and negative except as stated in HPI  Blood pressure (!) 145/90, pulse 90, temperature 97.8 F (36.6 C), temperature source Oral, resp. rate (!) 22, last menstrual period 11/02/2015, not currently breastfeeding. General appearance: alert, cooperative and appears stated age Lungs: clear to auscultation bilaterally Heart:  regular rate and rhythm Abdomen: soft, non-tender; bowel sounds normal Extremities: No calf swelling or tenderness Presentation: cephalic by exam Fetal monitoring: category 1  Uterine activity: contractions 2 cm Dilation: 1 Exam by:: Raelyn Mora CNM   Prenatal labs: ABO, Rh: --/--/O POS (05/16 1610) Antibody: PENDING (05/16 0910) Rubella: immune RPR: Non Reactive (05/12 1421)  HBsAg: Negative (05/12 1421)  HIV: Non Reactive (05/12 1421)  GBS:   negative Anatomy US: normal  Prenatal Transfer Tool  Maternal Diabetes: No Genetic Screening: Declined Maternal Ultrasounds/Referrals: Normal Fetal Ultrasounds or other Referrals:  None Maternal Substance Abuse:  Yes:  Type: Marijuana, Cocaine Significant Maternal Medications:   None Significant Maternal Lab Results: Lab values include: Group B Strep negative  Results for orders placed or performed during the hospital encounter of 06/17/16 (from the past 24 hour(s))  CBC   Collection Time: 06/17/16  9:10 AM  Result Value Ref Range   WBC 17.2 (H) 4.0 - 10.5 K/uL   RBC 3.34 (L) 3.87 - 5.11 MIL/uL   Hemoglobin 10.9 (L) 12.0 - 15.0 g/dL   HCT 96.0 (L) 45.4 - 09.8 %   MCV 94.9 78.0 - 100.0 fL   MCH 32.6 26.0 - 34.0 pg   MCHC 34.4 30.0 - 36.0 g/dL   RDW 11.9 14.7 - 82.9 %   Platelets 193 150 - 400 K/uL  Comprehensive metabolic panel   Collection Time: 06/17/16  9:10 AM  Result Value Ref Range   Sodium 136 135 - 145 mmol/L   Potassium 4.1 3.5 - 5.1 mmol/L   Chloride 103 101 - 111 mmol/L   CO2 25 22 - 32 mmol/L   Glucose, Bld 69 65 - 99 mg/dL   BUN 10 6 - 20 mg/dL   Creatinine, Ser 5.62 0.44 - 1.00 mg/dL   Calcium 9.5 8.9 - 13.0 mg/dL   Total Protein 6.8 6.5 - 8.1 g/dL   Albumin 3.2 (L) 3.5 - 5.0 g/dL   AST 23 15 - 41 U/L   ALT 11 (L) 14 - 54 U/L   Alkaline Phosphatase 236 (H) 38 - 126 U/L   Total Bilirubin 0.7 0.3 - 1.2 mg/dL   GFR calc non Af Amer >60 >60 mL/min   GFR calc Af Amer >60 >60 mL/min   Anion gap 8 5 - 15  Type and screen [facility]   Collection Time: 06/17/16  9:10 AM  Result Value Ref Range   ABO/RH(D) O POS    Antibody Screen PENDING    Sample Expiration 06/20/2016   Urinalysis, Routine w reflex microscopic   Collection Time: 06/17/16  9:30 AM  Result Value Ref Range   Color, Urine YELLOW YELLOW   APPearance CLEAR CLEAR   Specific Gravity, Urine 1.011 1.005 - 1.030   pH 6.0 5.0 - 8.0   Glucose, UA NEGATIVE NEGATIVE mg/dL   Hgb urine dipstick NEGATIVE NEGATIVE   Bilirubin Urine NEGATIVE NEGATIVE   Ketones, ur NEGATIVE NEGATIVE mg/dL   Protein, ur NEGATIVE NEGATIVE mg/dL   Nitrite NEGATIVE NEGATIVE   Leukocytes, UA LARGE (A) NEGATIVE   RBC / HPF 0-5 0 - 5 RBC/hpf   WBC, UA TOO NUMEROUS TO COUNT 0 - 5 WBC/hpf    Bacteria, UA RARE (A) NONE SEEN   Squamous Epithelial / LPF 0-5 (A) NONE SEEN   Mucous PRESENT     Patient Active Problem List   Diagnosis Date Noted  . Cocaine abuse affecting pregnancy in third trimester 06/17/2016  . Labor and  delivery, indication for care 06/17/2016  . Preterm premature rupture of membranes (PPROM) with unknown onset of labor 06/13/2016  . No prenatal care in current pregnancy 06/13/2016  . Severe dysplasia of cervix (CIN III) 11/22/2014    Assessment: Pamela [name] is a 31 y.o. [id number] at 7931w1d here for Possible preterm labor  #Labor: patient is having contractions every 2 minutes. Concern for abruption with severe pain and history of cocaine  use. Will start magnesium. #Pain: IV fentanyl if begins to make cervical change can have epdirual #FWB: caetrogry 1 #ID:  GBS negative on 5/12 #MOF: bottle #MOC: unsure #elevated WBC - possible rupture - could be infected. No fever will hold off on antibiotics at this time. She did get steroids which could explain the elevated WBC. #elevated BP: PIH labs normal. Pr/Cr pendings still. Likely secondary to drug use or pain.   Pamela Pennaicholas Kidada Ging 06/17/2016, 10:13 AM

## 2016-06-17 NOTE — Progress Notes (Signed)
MD wanting to wait on epidural placement until we encounter cervical change

## 2016-06-17 NOTE — Progress Notes (Signed)
[name], [facility] use, and considering adoption.  CSW is familiar with patient from past deliveries.  CSW called RN to ask if patient would like to speak with CSW at this time or would rather wait until after delivery.  Patient states she would like to speak with CSW after delivery.  CSW asked RN to call CSW at any time by patient's request.

## 2016-06-17 NOTE — Progress Notes (Signed)
If this round of IV meds does not help with pain control, consider epidural.

## 2016-06-17 NOTE — MAU Provider Note (Signed)
History     CSN: [id number]  Arrival date and time: 06/17/16 7893   First Provider Initiated Contact with Patient 06/17/16 0915      No chief complaint on file.  HPI   Ms.Pamela [name] is a 31 y.o. female [phone] @ 39w1dhere in MAU with contractions. Patient has a history of no prenatal care, gestational HTN and MI due to cocaine use.  Patient was here on Sunday and left AMA.  She was admitted at 325w5dor PPROM.  Last AFI on 5/13 was 16 cm. Patient said her water "sealed over" and she left AMA.  Patient is an active cocaine user and this baby is BUFA. She last used cocaine on 5/13 after she left the hospital. Patient rates her abdominal pain 10/10.   Active fetal movement, denies vaginal bleeding. No further leaking.   OB History    Gravida Para Term Preterm AB Living   _0 SAB TAB Ectopic Multiple Live Births         0 5      Past Medical History:  Diagnosis Date  . Depression    doing ok  . Gonorrhea   . Infection    UTI  . [facility]   [facility] stated age [age][facility] MI as a result of cocaine use  . Ovarian cyst   . Pregnancy induced hypertension   . Preterm labor   . Vaginal Pap smear, abnormal    had bx    Past Surgical History:  Procedure Laterality Date  . NO PAST SURGERIES      Family History  Problem Relation Age of Onset  . Family history unknown: Yes    Social History  Substance Use Topics  . Smoking status: Current Some Day Smoker    Packs/day: 0.50    Years: 12.00    Types: Cigarettes  . Smokeless tobacco: Never Used  . Alcohol use No    Allergies:  Allergies  Allergen Reactions  . Penicillins Hives     Has patient had a PCN reaction causing immediate rash, facial/tongue/throat swelling, SOB or lightheadedness with hypotension: Unknown Has patient had a PCN reaction causing severe rash involving mucus membranes or skin necrosis: Unknown Has patient had a PCN reaction that required hospitalization No Has patient  had a PCN reaction occurring within the last 10 years: No If all of the above answers are "NO", then may proceed with Cephalosporin use.     Prescriptions Prior to Admission  Medication Sig Dispense Refill Last Dose  . acetaminophen (TYLENOL) 325 MG tablet Take 650 mg by mouth every 6 (six) hours as needed for mild pain.   Past Week at Unknown time  . Prenatal Vit-Fe Fumarate-FA (PRENATAL MULTIVITAMIN) TABS tablet Take 1 tablet by mouth daily at 12 noon.   Past Week at Unknown time   Results for orders placed or performed during the hospital encounter of 06/17/16 (from the past 48 hour(s))  CBC     Status: Abnormal   Collection Time: 06/17/16  9:10 AM  Result Value Ref Range   WBC 17.2 (H) 4.0 - 10.5 K/uL   RBC 3.34 (L) 3.87 - 5.11 MIL/uL   Hemoglobin 10.9 (L) 12.0 - 15.0 g/dL   HCT 31.7 (L) 36.0 - 46.0 %   MCV 94.9 78.0 - 100.0 fL   MCH 32.6 26.0 - 34.0 pg   MCHC 34.4 30.0 - 36.0 g/dL   RDW 13.9 11.5 -  15.5 %   Platelets 193 150 - 400 K/uL  Comprehensive metabolic panel     Status: Abnormal   Collection Time: 06/17/16  9:10 AM  Result Value Ref Range   Sodium 136 135 - 145 mmol/L   Potassium 4.1 3.5 - 5.1 mmol/L   Chloride 103 101 - 111 mmol/L   CO2 25 22 - 32 mmol/L   Glucose, Bld 69 65 - 99 mg/dL   BUN 10 6 - 20 mg/dL   Creatinine, Ser 0.50 0.44 - 1.00 mg/dL   Calcium 9.5 8.9 - 10.3 mg/dL   Total Protein 6.8 6.5 - 8.1 g/dL   Albumin 3.2 (L) 3.5 - 5.0 g/dL   AST 23 15 - 41 U/L   ALT 11 (L) 14 - 54 U/L   Alkaline Phosphatase 236 (H) 38 - 126 U/L   Total Bilirubin 0.7 0.3 - 1.2 mg/dL   GFR calc non Af Amer >60 >60 mL/min   GFR calc Af Amer >60 >60 mL/min    Comment: (NOTE) The eGFR has been calculated using the CKD EPI equation. This calculation has not been validated in all clinical situations. eGFR's persistently <60 mL/min signify possible Chronic Kidney Disease.    Anion gap 8 5 - 15    Review of Systems  Gastrointestinal: Positive for abdominal pain.    Physical Exam   Blood pressure (!) 164/108, pulse 91, temperature 98 F (36.7 C), resp. rate 18, last menstrual period 11/02/2015, not currently breastfeeding.  Physical Exam  Constitutional: She is oriented to person, place, and time. She appears distressed.  HENT:  Head: Normocephalic.  Respiratory: Tachypnea noted. No respiratory distress.  GI: Soft.  Genitourinary:  Genitourinary Comments: Dilation: 1 Exam by:: Laury Deep CNM  Musculoskeletal: Normal range of motion.  Neurological: She is alert and oriented to person, place, and time.  Skin: Skin is warm.  Psychiatric: Her affect is angry, blunt and inappropriate. Her speech is rapid and/or pressured. She is agitated, aggressive and hyperactive. She expresses impulsivity and inappropriate judgment.   Fetal Tracing: Baseline: 145 bpm  Variability: Moderate  Accelerations: 15x15 Decelerations: variables  Toco: Q2-3  MAU Course  Procedures  None  MDM  [place] to evaluate for placenta abruption Vistaril 50 mg given IM IV fentanyl 50 mcg IV UA & Urine drug screen PIH labs with PCR BP 164/108 Preeclampsia protocol initiated  Cervix rechecked due to pain @ 0930: 1cm, 90%, 0 station. Bloody show noted  Concerning for placenta abruption> BP 164/108, recent cocaine use. Severe abdominal pain.   Assessment and Plan   A:  1. Active preterm labor, single or unspecified fetus   2. Preterm premature rupture of membranes (PPROM) with unknown onset of labor   3. No prenatal care in current pregnancy in third trimester   4. Cocaine use complicating pregnancy   5. [redacted] weeks gestation of pregnancy   6. Abdominal pain affecting pregnancy      P:  Admit to birthing suits Guarded condition Magnesium per Dr. Ilda Basset  Betamethasone course completed on 5/12 & 5/13  Katee Wentland, Artist Pais, NP 06/17/2016 9:52 AM

## 2016-06-17 NOTE — Progress Notes (Signed)
RN remains at bedside  

## 2016-06-17 NOTE — Progress Notes (Signed)
Difficult to keep pt still during procedure.  Calming methods used to assist pt through procedure.

## 2016-06-17 NOTE — Progress Notes (Signed)
NICU updated on pt status.

## 2016-06-17 NOTE — Progress Notes (Addendum)
Pt refusing Foley catheter.  Pt electing to sit on bedpan with no urine noted. Discussed importance of emptying bladder especially with Mag.  Pt still refusing.  Wants to remain on bedpan.  Call bell within reach.  Siderails up x2.

## 2016-06-17 NOTE — MAU Note (Signed)
Pt presents to MAU with complaints of contractions that started 15 minutes ago. Denies any VB or LOF. Last cocaine use 06/14/16.

## 2016-06-17 NOTE — MAU Note (Signed)
Ultrasound at bedside

## 2016-06-17 NOTE — Anesthesia Pain Management Evaluation Note (Signed)
  CRNA Pain Management Visit Note  Patient: Pamela [name], 31 y.o., female  "Hello [facility]. We have an anesthesia team available at all times to provide care throughout the hospital, including epidural management and anesthesia for C-section. I don't know your plan for the delivery whether it a natural birth, water birth, IV sedation, nitrous supplementation, doula or epidural, but we want to meet your pain goals."   1.Was your pain managed to your expectations on prior hospitalizations?   Yes   2.What is your expectation for pain management during this hospitalization?     Epidural  3.How can we help you reach that goal? epidural  Record the patient's initial score and the patient's pain goal.   Pain: 0  Pain Goal: 4 [facility] wants you to be able to say your pain was always managed very well.  Toran Murch 06/17/2016

## 2016-06-17 NOTE — Progress Notes (Signed)
Patient seen doing well. Much more comfortable after epidural. D/W Dr. Lynetta MareAnyawu who initially admitted the patient for PPROM and she is certain of rupture. Will stop magnesium at this time and monitor. Plan to check around 7PM. Cervix on last check 2/100/0.  Ernestina PennaNicholas Yarden Manuelito, MD 06/17/16 2:36 PM

## 2016-06-17 NOTE — Anesthesia Procedure Notes (Signed)
Epidural Patient location during procedure: OB Start time: 06/17/2016 12:28 PM End time: 06/17/2016 12:54 PM  Staffing Anesthesiologist: Anitra LauthMILLER, WARREN RAY Performed: anesthesiologist   Preanesthetic Checklist Completed: patient identified, site marked, surgical consent, pre-op evaluation, timeout performed, IV checked, risks and benefits discussed and monitors and equipment checked  Epidural Patient position: sitting Prep: DuraPrep Patient monitoring: heart rate, cardiac monitor, continuous pulse ox and blood pressure Approach: midline Location: L2-L3 Injection technique: LOR air  Needle:  Needle type: Tuohy  Needle gauge: 17 G Needle length: 9 cm Needle insertion depth: 4 cm Catheter type: closed end flexible Catheter size: 20 Guage Catheter at skin depth: 7 cm Test dose: negative  Assessment Events: blood not aspirated, injection not painful, no injection resistance, negative IV test and no paresthesia  Additional Notes Reason for block:procedure for pain

## 2016-06-17 NOTE — Anesthesia Preprocedure Evaluation (Signed)
Anesthesia Evaluation  Patient identified by MRN, date of birth, ID band Patient awake    Reviewed: Allergy & Precautions, NPO status , Patient's Chart, lab work & pertinent test results  Airway Mallampati: II  TM Distance: >3 FB Neck ROM: Full    Dental no notable dental hx.    Pulmonary neg pulmonary ROS, Current Smoker,    Pulmonary exam normal breath sounds clear to auscultation       Cardiovascular hypertension, negative cardio ROS Normal cardiovascular exam Rhythm:Regular Rate:Normal     Neuro/Psych negative neurological ROS  negative psych ROS   GI/Hepatic negative GI ROS, Neg liver ROS,   Endo/Other  negative endocrine ROS  Renal/GU negative Renal ROS  negative genitourinary   Musculoskeletal negative musculoskeletal ROS (+)   Abdominal   Peds negative pediatric ROS (+)  Hematology negative hematology ROS (+)   Anesthesia Other Findings   Reproductive/Obstetrics negative OB ROS                             Anesthesia Physical Anesthesia Plan  ASA: II  Anesthesia Plan: Epidural   Post-op Pain Management:    Induction:   Airway Management Planned:   Additional Equipment:   Intra-op Plan:   Post-operative Plan:   Informed Consent: I have reviewed the patients History and Physical, chart, labs and discussed the procedure including the risks, benefits and alternatives for the proposed anesthesia with the patient or authorized representative who has indicated his/her understanding and acceptance.   Dental advisory given  Plan Discussed with: CRNA  Anesthesia Plan Comments:         Anesthesia Quick Evaluation

## 2016-06-18 LAB — RPR: RPR: NONREACTIVE

## 2016-06-18 NOTE — Anesthesia Postprocedure Evaluation (Signed)
Anesthesia Post Note  Patient: Pamela [name]  Procedure(s) Performed: * No procedures listed *  Patient location during evaluation: Mother Baby Anesthesia Type: Epidural Level of consciousness: awake and alert and oriented Pain management: satisfactory to patient Vital Signs Assessment: post-procedure vital signs reviewed and stable Respiratory status: spontaneous breathing and nonlabored ventilation Cardiovascular status: stable Postop Assessment: no headache, no backache, no signs of nausea or vomiting, adequate PO intake and patient able to bend at knees (patient up walking) Anesthetic complications: no        Last Vitals:  Vitals:   06/18/16 0200 06/18/16 0800  BP: 113/72 131/61  Pulse: 84 73  Resp: 16 18  Temp: 37.3 C 37 C    Last Pain:  Vitals:   06/18/16 0845  TempSrc:   PainSc: 3    Pain Goal: Patients Stated Pain Goal: 3 (06/18/16 0845)               Madison HickmanGREGORY,Elania Crowl

## 2016-06-18 NOTE — Progress Notes (Signed)
Daily Post Partum Note  Railynn Lyndle Herrlich Rockhold is a 31 y.o. [id number] PPD#1 s/p  SVD/intact perineum  @ 2093w1d.  Pregnancy c/b no prenatal, difficult social situation, polysubstance abuse, PPROM at 32wks, likely HTN from cocaine abuse 24hr/overnight events:  none  Subjective:  Meeting all PP goals  Objective:    Current Vital Signs 24h Vital Sign Ranges  T 99.1 F (37.3 C) Temp  Avg: 98.9 F (37.2 C)  Min: 97.8 F (36.6 C)  Max: 99.5 F (37.5 C)  BP 113/72 BP  Min: 90/54  Max: 166/95  HR 84 Pulse  Avg: 96.3  Min: 84  Max: 130  RR 16 Resp  Avg: 17.5  Min: 15  Max: 22  SaO2 95 % Not Delivered SpO2  Avg: 96.7 %  Min: 95 %  Max: 100 %       24 Hour I/O Current Shift I/O  Time Ins Outs 05/16 0701 - 05/17 0700 In: 1605.7 [P.O.:240; I.V.:1365.7] Out: 1875 [Urine:1725] No intake/output data recorded.    Patient Vitals for the past 12 hrs:  BP Temp Temp src Pulse Resp SpO2 Height Weight  06/18/16 0200 113/72 99.1 F (37.3 C) Oral 84 16 95 % - -  06/17/16 2200 112/66 99.5 F (37.5 C) Oral 100 16 95 % - -  06/17/16 2100 138/84 99.1 F (37.3 C) Oral (!) 103 20 100 % 5\' 4"  (1.626 m) 59.4 kg (131 lb)  06/17/16 1915 - - - - 15 - - -    General: NAD Abdomen: soft, nttp, FF below the umbilicus Perineum: deferred Skin:  Warm and dry.  Cardiovascular: S1, S2 normal, no murmur, rub or gallop, regular rate and rhythm Respiratory:  Clear to auscultation bilateral. Normal respiratory effort Extremities: no c/c/e  Medications Current Facility-Administered Medications  Medication Dose Route Frequency Provider Last Rate Last Dose  . acetaminophen (TYLENOL) tablet 650 mg  650 mg Oral Q4H PRN Diallo, Abdoulaye, MD      . benzocaine-Menthol (DERMOPLAST) 20-0.5 % topical spray 1 application  1 application Topical PRN Diallo, Abdoulaye, MD   1 application at 06/17/16 2345  . coconut oil  1 application Topical PRN Diallo, Abdoulaye, MD      . witch hazel-glycerin (TUCKS) pad 1 application  1  application Topical PRN Diallo, Abdoulaye, MD       And  . dibucaine (NUPERCAINAL) 1 % rectal ointment 1 application  1 application Rectal PRN Diallo, Abdoulaye, MD      . diphenhydrAMINE (BENADRYL) capsule 25 mg  25 mg Oral Q6H PRN Diallo, Abdoulaye, MD      . ibuprofen (ADVIL,MOTRIN) tablet 600 mg  600 mg Oral Q6H Diallo, Abdoulaye, MD   600 mg at 06/18/16 0600  . ondansetron (ZOFRAN) tablet 4 mg  4 mg Oral Q4H PRN Diallo, Abdoulaye, MD       Or  . ondansetron (ZOFRAN) injection 4 mg  4 mg Intravenous Q4H PRN Diallo, Abdoulaye, MD      . prenatal multivitamin tablet 1 tablet  1 tablet Oral Q1200 Diallo, Abdoulaye, MD      . senna-docusate (Senokot-S) tablet 2 tablet  2 tablet Oral Q24H Diallo, Abdoulaye, MD   2 tablet at 06/17/16 2346  . simethicone (MYLICON) chewable tablet 80 mg  80 mg Oral PRN Diallo, Abdoulaye, MD      . Tdap (BOOSTRIX) injection 0.5 mL  0.5 mL Intramuscular Once Lovena Neighboursiallo, Abdoulaye, MD        Labs:   Recent Labs Lab 06/13/16 1421  06/17/16 0910  WBC 9.9 17.2*  HGB 10.9* 10.9*  HCT 32.2* 31.7*  PLT 194 193    Recent Labs Lab 06/17/16 0910  NA 136  K 4.1  CL 103  CO2 25  BUN 10  CREATININE 0.50  GLUCOSE 69  CALCIUM 9.5   UDS 5/16: +ccaine  Assessment & Plan:  Pt doing well *Postpartum/postop: routine care *Social: SW consult *HTN: BPs normalizing on their own; negative pre-x labs on admit *CIN 3: needs colpo PP *Dispo: likely tomorrow. Pt doesn't have medicaid yet (has to see SW to set that up). inbasket message sent to clinic to have pt come back for 1-2wk PP visit and can sign BTL paperwork then  O POS / Rubella Immune / Varicella Unknown/  RPR negative / HIV negative / HepBsAg negative / Tdap UTD: ordered/pap HSIL 08/2014 and CIN3 with +margins 01/2015 /  / Contraception: wants BTL but doesn't have medicaid and didn't sign papers. /  Cornelia Copa MD Attending Center for [facility])

## 2016-06-18 NOTE — Progress Notes (Signed)
Pt. Is up at [facility]. "I am ready to go. I have to get some important papers, I am ready to sign the papers to leave. "I do not need the Dr. Understands all instructions for home use, Ambulates well Discharged in the care of friends.

## 2016-06-23 NOTE — Discharge Summary (Signed)
Pamela [name] is a 31 y.o. [id number] PPD#1 s/p  SVD/intact perineum  @ 5838w1d.  Pregnancy c/b no prenatal, difficult social situation, polysubstance abuse, PPROM at 32wks, likely HTN from cocaine abuse 24hr/overnight events:  none  Subjective:  Meeting all PP goals  Objective:    Current Vital Signs 24h Vital Sign Ranges  T 99.1 F (37.3 C) Temp  Avg: 98.9 F (37.2 C)  Min: 97.8 F (36.6 C)  Max: 99.5 F (37.5 C)  BP 113/72 BP  Min: 90/54  Max: 166/95  HR 84 Pulse  Avg: 96.3  Min: 84  Max: 130  RR 16 Resp  Avg: 17.5  Min: 15  Max: 22  SaO2 95 % Not Delivered SpO2  Avg: 96.7 %  Min: 95 %  Max: 100 %       24 Hour I/O Current Shift I/O  Time Ins Outs 05/16 0701 - 05/17 0700 In: 1605.7 [P.O.:240; I.V.:1365.7] Out: 1875 [Urine:1725] No intake/output data recorded.    Patient Vitals for the past 12 hrs:  BP Temp Temp src Pulse Resp SpO2 Height Weight  06/18/16 0200 113/72 99.1 F (37.3 C) Oral 84 16 95 % - -  06/17/16 2200 112/66 99.5 F (37.5 C) Oral 100 16 95 % - -  06/17/16 2100 138/84 99.1 F (37.3 C) Oral (!) 103 20 100 % 5\' 4"  (1.626 m) 59.4 kg (131 lb)  06/17/16 1915 - - - - 15 - - -    General: NAD Abdomen: soft, nttp, FF below the umbilicus Perineum: deferred Skin:  Warm and dry.  Cardiovascular: S1, S2 normal, no murmur, rub or gallop, regular rate and rhythm Respiratory:  Clear to auscultation bilateral. Normal respiratory effort Extremities: no c/c/e  Medications          Current Facility-Administered Medications  Medication Dose Route Frequency Provider Last Rate Last Dose  . acetaminophen (TYLENOL) tablet 650 mg  650 mg Oral Q4H PRN Diallo, Abdoulaye, MD      . benzocaine-Menthol (DERMOPLAST) 20-0.5 % topical spray 1 application  1 application Topical PRN Diallo, Abdoulaye, MD   1 application at 06/17/16 2345  . coconut oil  1 application Topical PRN Diallo, Abdoulaye, MD      . witch hazel-glycerin (TUCKS) pad 1 application  1  application Topical PRN Diallo, Abdoulaye, MD       And  . dibucaine (NUPERCAINAL) 1 % rectal ointment 1 application  1 application Rectal PRN Diallo, Abdoulaye, MD      . diphenhydrAMINE (BENADRYL) capsule 25 mg  25 mg Oral Q6H PRN Diallo, Abdoulaye, MD      . ibuprofen (ADVIL,MOTRIN) tablet 600 mg  600 mg Oral Q6H Diallo, Abdoulaye, MD   600 mg at 06/18/16 0600  . ondansetron (ZOFRAN) tablet 4 mg  4 mg Oral Q4H PRN Diallo, Abdoulaye, MD       Or  . ondansetron (ZOFRAN) injection 4 mg  4 mg Intravenous Q4H PRN Diallo, Abdoulaye, MD      . prenatal multivitamin tablet 1 tablet  1 tablet Oral Q1200 Diallo, Abdoulaye, MD      . senna-docusate (Senokot-S) tablet 2 tablet  2 tablet Oral Q24H Diallo, Abdoulaye, MD   2 tablet at 06/17/16 2346  . simethicone (MYLICON) chewable tablet 80 mg  80 mg Oral PRN Diallo, Abdoulaye, MD      . Tdap (BOOSTRIX) injection 0.5 mL  0.5 mL Intramuscular Once Diallo, Lilia ArgueAbdoulaye, MD        Labs:   Beau FannyLastLabs  Recent Labs Lab 06/13/16 1421 06/17/16 0910  WBC 9.9 17.2*  HGB 10.9* 10.9*  HCT 32.2* 31.7*  PLT 194 193      LastLabs   Recent Labs Lab 06/17/16 0910  NA 136  K 4.1  CL 103  CO2 25  BUN 10  CREATININE 0.50  GLUCOSE 69  CALCIUM 9.5     UDS 5/16: +ccaine  Assessment & Plan:  Pt doing well *Postpartum/postop: routine care *Social: SW consult *HTN: BPs normalizing on their own; negative pre-x labs on admit *CIN 3: needs colpo PP *Dispo: likely tomorrow. Pt doesn't have medicaid yet (has to see SW to set that up). inbasket message sent to clinic to have pt come back for 1-2wk PP visit and can sign BTL paperwork then  O POS / Rubella Immune / Varicella Unknown/  RPR negative / HIV negative / HepBsAg negative / Tdap UTD: ordered/pap HSIL 08/2014 and CIN3 with +margins 01/2015 /  / Contraception: wants BTL but doesn't have medicaid and didn't sign papers. /  Cornelia Copa MD Attending Center for [facility]  (Faculty Practice)   Addendum: this pt left against medical advice without being seen shortly after this note was written above by Dr. Vergie Living.   The pt was not seen by me at all.  Pamela [name], M.D., [place]n Core

## 2016-07-02 ENCOUNTER — Ambulatory Visit: Payer: Self-pay | Admitting: Obstetrics and Gynecology

## 2016-07-05 NOTE — Discharge Summary (Signed)
Physician Discharge Summary  Patient ID: Pamela [name] MRN: [id number] DOB/AGE: 30/09/1985 30 y.o.  Admit date: 06/13/2016 Discharge date: 07/05/2016  Admission Diagnoses: Patient Active Problem List   Diagnosis Date Noted  . Cocaine abuse affecting pregnancy in third trimester 06/17/2016  . Labor and delivery, indication for care 06/17/2016  . Preterm premature rupture of membranes (PPROM) with unknown onset of labor 06/13/2016  . No prenatal care in current pregnancy 06/13/2016  . Severe dysplasia of cervix (CIN III) 11/22/2014     Discharge Diagnoses:  Principal Problem:   Preterm premature rupture of membranes (PPROM) with unknown onset of labor Active Problems:   No prenatal care in current pregnancy   Discharged Condition: left [facility]   Hospital Course:  Pamela [name] is a 31 y.o. [id number] at 104w6d by LMP who is admitted for PROM.   Fetal presentation is cephalic. Length of Stay:  2  Days  Subjective: Less fluid vaginally Patient reports the fetal movement as active. Patient reports uterine contraction  activity as none. Patient reports  vaginal bleeding as none. Patient describes fluid per vagina as Clear.  Vitals:  Blood pressure 104/63, pulse 88, temperature 98 F (36.7 C), temperature source Oral, resp. rate 16, height 5\' 4"  (1.626 m), weight 59.4 kg (131 lb), last menstrual period 11/02/2015, SpO2 96 %, not currently breastfeeding. Physical Examination:  General appearance - alert, well appearing, and in no distress Heart - normal rate and regular rhythm Abdomen - soft, nontender, nondistended Fundal Height:  size equals dates Cervical Exam: Not evaluated.  Extremities: extremities normal, atraumatic, no cyanosis or edema and Homans sign is negative, no sign of DVT Membranes:ruptured, clear fluid  Fetal Monitoring:     Fetal Heart Rate A  Mode Doppler filed at 06/15/2016 0228  Baseline Rate (A) 139 bpm filed at 06/15/2016 0228  Variability 6-25  BPM filed at 06/14/2016 1816  Accelerations 15 x 15 filed at 06/14/2016 1816  Decelerations None filed at 06/14/2016 1816       Labs:  No results found for this or any previous visit (from the past 24 hour(s)).    Medications:  Scheduled . clindamycin  300 mg Oral Q8H  . docusate sodium  100 mg Oral Daily  . prenatal multivitamin  1 tablet Oral Q1200   I have reviewed the patient's current medications.  ASSESSMENT:     Patient Active Problem List   Diagnosis Date Noted  . Preterm premature rupture of membranes (PPROM) with unknown onset of labor 06/13/2016  . No prenatal care in current pregnancy 06/13/2016  . Severe dysplasia of cervix (CIN III) 11/22/2014    PLAN: Continue to follow for s/sx PTL, infection   Pamela [name] 06/15/2016,7:08 AM  Pt ambulates off unit ama. Signs ama paper and states she will come back to the hospital if she starts to leak    Electronically signed by Deboraha Sprang, RN at 06/15/2016 2:55 PM      Consults: None  Significant Diagnostic Studies:   Treatments: IV hydration  Discharge Exam: Blood pressure 112/67, pulse (!) 101, temperature 97.6 F (36.4 C), temperature source Oral, resp. rate 16, height 5\' 4"  (1.626 m), weight 59.4 kg (131 lb), last menstrual period 11/02/2015, SpO2 100 %, unknown if currently breastfeeding. General appearance: alert, cooperative and no distress  Disposition: 07-Left Against Medical Advice/Left Without Being Seen/Elopement   Allergies as of 06/15/2016      Reactions   Penicillins Hives   Has patient had a PCN reaction  causing immediate rash, facial/tongue/throat swelling, SOB or lightheadedness with hypotension: Unknown Has patient had a PCN reaction causing severe rash involving mucus membranes or skin necrosis: Unknown Has patient had a PCN reaction that required hospitalization No Has patient had a PCN reaction occurring within the last 10 years: No If all of the above answers are  "NO", then may proceed with Cephalosporin use.      Medication List    ASK your doctor about these medications   acetaminophen 325 MG tablet Commonly known as:  TYLENOL Take 650 mg by mouth every 6 (six) hours as needed for mild pain.        Signed: Scheryl DarterJames [name] 07/05/2016, 7:12 AM

## 2016-07-30 ENCOUNTER — Ambulatory Visit: Payer: Self-pay | Admitting: Medical

## 2016-08-20 ENCOUNTER — Ambulatory Visit (INDEPENDENT_AMBULATORY_CARE_PROVIDER_SITE_OTHER): Payer: Medicaid Other | Admitting: Family Medicine

## 2016-08-20 ENCOUNTER — Encounter: Payer: Self-pay | Admitting: Family Medicine

## 2016-08-20 DIAGNOSIS — F1410 Cocaine abuse, uncomplicated: Secondary | ICD-10-CM

## 2016-08-20 DIAGNOSIS — R87613 High grade squamous intraepithelial lesion on cytologic smear of cervix (HGSIL): Secondary | ICD-10-CM

## 2016-08-20 DIAGNOSIS — O99323 Drug use complicating pregnancy, third trimester: Secondary | ICD-10-CM

## 2016-08-20 NOTE — Patient Instructions (Signed)

## 2016-08-20 NOTE — Progress Notes (Signed)
Subjective:     Pamela [name] is a 31 y.o. female who presents for a postpartum visit. She is 9 weeks postpartum following a spontaneous vaginal delivery. I have fully reviewed the prenatal and intrapartum course. The delivery was at 32 gestational weeks. Outcome: spontaneous vaginal delivery. Anesthesia: epidural. Postpartum course has been normal. Bleeding staining only. Bowel function is abnormal: constipation. Bladder function is normal. Patient is not sexually active. Contraception method is none. Postpartum depression screening: positive - pt declines seeing Jaime.  The following portions of the patient's history were reviewed and updated as appropriate: allergies, current medications, past family history, past medical history, past social history, past surgical history and problem list.  Patient has history of drug abuse - cocaine. Used during pregnancy. States that she hasn't used since delivery because she felt guilty. Denies IV drug use - was using intranasally. Currently living at hotel. Looking at doing rehab.  Review of Systems Pertinent items are noted in HPI.   Objective:    BP 105/75   Pulse 96   Ht 5\' 4"  (1.626 m)   Wt 112 lb 12.8 oz (51.2 kg)   Breastfeeding? No Comment: Patient is not raising her baby   BMI 19.36 kg/m    General:  alert, cooperative and no distress  Lungs: clear to auscultation bilaterally  Heart:  regular rate and rhythm, S1, S2 normal, no murmur, click, rub or gallop  Abdomen: soft, non-tender; bowel sounds normal; no masses,  no organomegaly        Assessment:     Normal postpartum exam. Pap smear not done at today's visit.   Plan:    1. Contraception: none 2. Patient states that she has to leave, but wants to return for PAP and contraception. 3. Follow up in: at earliest convenience..Marland Kitchen

## 2016-09-11 ENCOUNTER — Ambulatory Visit: Payer: Medicaid Other | Admitting: Family Medicine

## 2016-10-01 ENCOUNTER — Encounter: Payer: Self-pay | Admitting: Obstetrics & Gynecology

## 2016-10-01 ENCOUNTER — Ambulatory Visit (INDEPENDENT_AMBULATORY_CARE_PROVIDER_SITE_OTHER): Payer: Medicaid Other | Admitting: Obstetrics & Gynecology

## 2016-10-01 ENCOUNTER — Ambulatory Visit (INDEPENDENT_AMBULATORY_CARE_PROVIDER_SITE_OTHER): Payer: Medicaid Other | Admitting: Clinical

## 2016-10-01 VITALS — BP 112/80 | HR 93 | Ht 64.0 in | Wt 113.3 lb

## 2016-10-01 DIAGNOSIS — A5901 Trichomonal vulvovaginitis: Secondary | ICD-10-CM

## 2016-10-01 DIAGNOSIS — Z124 Encounter for screening for malignant neoplasm of cervix: Secondary | ICD-10-CM | POA: Diagnosis not present

## 2016-10-01 DIAGNOSIS — Z0000 Encounter for general adult medical examination without abnormal findings: Secondary | ICD-10-CM | POA: Diagnosis not present

## 2016-10-01 DIAGNOSIS — Z659 Problem related to unspecified psychosocial circumstances: Secondary | ICD-10-CM | POA: Insufficient documentation

## 2016-10-01 DIAGNOSIS — Z01419 Encounter for gynecological examination (general) (routine) without abnormal findings: Secondary | ICD-10-CM

## 2016-10-01 DIAGNOSIS — Z113 Encounter for screening for infections with a predominantly sexual mode of transmission: Secondary | ICD-10-CM | POA: Diagnosis not present

## 2016-10-01 DIAGNOSIS — Z3009 Encounter for other general counseling and advice on contraception: Secondary | ICD-10-CM | POA: Insufficient documentation

## 2016-10-01 DIAGNOSIS — Z658 Other specified problems related to psychosocial circumstances: Secondary | ICD-10-CM

## 2016-10-01 DIAGNOSIS — Z1151 Encounter for screening for human papillomavirus (HPV): Secondary | ICD-10-CM | POA: Diagnosis not present

## 2016-10-01 NOTE — BH Specialist Note (Signed)
Integrated Behavioral Health Initial Visit  MRN: [id number] Name: Pamela [name]   Session Start time: 2:50 Session End time: 2:59 Total time: 10 minutes  Type of Service: Integrated Behavioral Health- Individual/Family Interpretor:No. Interpretor Name and Language: n/a   Warm Hand Off Completed.       SUBJECTIVE: Pamela [name] is a 31 y.o. female accompanied by patient. Patient was referred by Dr Macon LargeAnyanwu for psychosocial stressors. Patient reports the following symptoms/concerns: Pt states her primary concern today is both food and housing insecurity; pt cannot stay to talk long today, but would like to find community resources to help with both food and housing.  Duration of problem: Undetermined; Severity of problem: moderate  OBJECTIVE: Mood: Appropriate and Affect: Appropriate Risk of harm to self or others: No plan to harm self or others   LIFE CONTEXT: Family and Social: Living in hotel; has 6 children(14,8,7,3,2,654months) School/Work: - Self-Care: - Life Changes: Childbirth almost 4 months ago; living in a hotel  GOALS ADDRESSED: Patient will reduce symptoms of: stress and increase knowledge and/or ability of: stress reduction and also: Increase healthy adjustment to current life circumstances and Increase adequate support systems for patient/family   INTERVENTIONS: Psychoeducation and/or Health Education and Link to [facility] Resources  Standardized Assessments completed: GAD-7 and PHQ 9  ASSESSMENT: Patient currently experiencing Psychosocial stressors. Patient may benefit from  community resources.  PLAN: 1. Follow up with behavioral health clinician on : As needed 2. Behavioral recommendations:  -Consider community resources(below) 3. Referral(s): Integrated Art gallery managerBehavioral Health Services (In Clinic) and [employer] Resources:  Arts administratorood, Actuaryinances, Housing, Biomedical engineerTransportation and Interactive Resource Center, [facility] 4. "From scale of 1-10, how  likely are you to follow plan?": 8  Rae LipsJamie C McMannes, LCSWA  Depression screen [facility] 2/9 10/01/2016 08/20/2016  Decreased Interest 1 1  Down, Depressed, Hopeless 1 2  PHQ - 2 Score 2 3  Altered sleeping 2 2  Tired, decreased energy 1 1  Change in appetite 2 1  Feeling bad or failure about yourself  1 1  Trouble concentrating 0 0  Moving slowly or fidgety/restless 0 3  Suicidal thoughts 0 0  PHQ-9 Score 8 11   GAD 7 : Generalized Anxiety Score 10/01/2016 08/20/2016  Nervous, Anxious, on Edge 0 0  Control/stop worrying 1 0  Worry too much - different things 2 1  Trouble relaxing 1 1  Restless 0 0  Easily annoyed or irritable 2 1  Afraid - awful might happen 0 0  Total GAD 7 Score 6 3    Outpatient Encounter Prescriptions as of 10/01/2016  Medication Sig  . acetaminophen (TYLENOL) 325 MG tablet Take 650 mg by mouth every 6 (six) hours as needed for mild pain.   No facility-administered encounter medications on file as of 10/01/2016.

## 2016-10-01 NOTE — Patient Instructions (Signed)
Preventive Care 18-39 Years, Female Preventive care refers to lifestyle choices and visits with your health care provider that can promote health and wellness. What does preventive care include?  A yearly physical exam. This is also called an annual well check.  Dental exams once or twice a year.  Routine eye exams. Ask your health care provider how often you should have your eyes checked.  Personal lifestyle choices, including: ? Daily care of your teeth and gums. ? Regular physical activity. ? Eating a healthy diet. ? Avoiding tobacco and drug use. ? Limiting alcohol use. ? Practicing safe sex. ? Taking vitamin and mineral supplements as recommended by your health care provider. What happens during an annual well check? The services and screenings done by your health care provider during your annual well check will depend on your age, overall health, lifestyle risk factors, and family history of disease. Counseling Your health care provider may ask you questions about your:  Alcohol use.  Tobacco use.  Drug use.  Emotional well-being.  Home and relationship well-being.  Sexual activity.  Eating habits.  Work and work Statistician.  Method of birth control.  Menstrual cycle.  Pregnancy history.  Screening You may have the following tests or measurements:  Height, weight, and BMI.  Diabetes screening. This is done by checking your blood sugar (glucose) after you have not eaten for a while (fasting).  Blood pressure.  Lipid and cholesterol levels. These may be checked every 5 years starting at age 66.  Skin check.  Hepatitis C blood test.  Hepatitis B blood test.  Sexually transmitted disease (STD) testing.  BRCA-related cancer screening. This may be done if you have a family history of breast, ovarian, tubal, or peritoneal cancers.  Pelvic exam and Pap test. This may be done every 3 years starting at age 40. Starting at age 59, this may be done every 5  years if you have a Pap test in combination with an HPV test.  Discuss your test results, treatment options, and if necessary, the need for more tests with your health care provider. Vaccines Your health care provider may recommend certain vaccines, such as:  Influenza vaccine. This is recommended every year.  Tetanus, diphtheria, and acellular pertussis (Tdap, Td) vaccine. You may need a Td booster every 10 years.  Varicella vaccine. You may need this if you have not been vaccinated.  HPV vaccine. If you are 69 or younger, you may need three doses over 6 months.  Measles, mumps, and rubella (MMR) vaccine. You may need at least one dose of MMR. You may also need a second dose.  Pneumococcal 13-valent conjugate (PCV13) vaccine. You may need this if you have certain conditions and were not previously vaccinated.  Pneumococcal polysaccharide (PPSV23) vaccine. You may need one or two doses if you smoke cigarettes or if you have certain conditions.  Meningococcal vaccine. One dose is recommended if you are age 27-21 years and a first-year college student living in a residence hall, or if you have one of several medical conditions. You may also need additional booster doses.  Hepatitis A vaccine. You may need this if you have certain conditions or if you travel or work in places where you may be exposed to hepatitis A.  Hepatitis B vaccine. You may need this if you have certain conditions or if you travel or work in places where you may be exposed to hepatitis B.  Haemophilus influenzae type b (Hib) vaccine. You may need this if  you have certain risk factors.  Talk to your health care provider about which screenings and vaccines you need and how often you need them. This information is not intended to replace advice given to you by your health care provider. Make sure you discuss any questions you have with your health care provider. Document Released: 03/17/2001 Document Revised: 10/09/2015  Document Reviewed: 11/20/2014 Elsevier Interactive Patient Education  2017 [employer].

## 2016-10-01 NOTE — Progress Notes (Signed)
GYNECOLOGY ANNUAL PREVENTATIVE CARE ENCOUNTER NOTE  Subjective:   Pamela [name] is a 31 y.o. [phone] female here for a routine annual gynecologic exam.  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   Denies abnormal vaginal bleeding, discharge, pelvic pain, problems with intercourse or other gynecologic concerns. Does not want any birth control today. Did have unprotected sex in the last 2 weeks.   Gynecologic History Patient's last menstrual period was 09/21/2016 (approximate). 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 Last mammogram: N/A.   Obstetric History OB History  Gravida Para Term Preterm AB Living  6 6 2 4   6   SAB TAB Ectopic Multiple Live Births        0 6    # Outcome Date GA Lbr Len/2nd Weight Sex Delivery Anes PTL Lv  6 Preterm 06/17/16 [redacted]w[redacted]d 10:13 / 00:36 5 lb 1.5 oz (2.31 kg) F Vag-Spont EPI  LIV     Birth Comments: premature; cyanotic transferred to NICU  5 Preterm 07/03/14  02:50 / 00:10 3 lb 9.9 oz (1.64 kg) F Vag-Spont None  LIV     Complications: Preterm labor  4 Preterm 07/25/13 [redacted]w[redacted]d  6 lb 11 oz (3.033 kg) F Vag-Spont None  LIV     Birth Comments: *apgars assigned by EMS to be 8/9*  3 Preterm 11/2009 [redacted]w[redacted]d   M Vag-Spont EPI N LIV  2 Term 07/2008 [redacted]w[redacted]d   M Vag-Spont EPI N LIV  1 Term 2004 [redacted]w[redacted]d   F Vag-Spont EPI N LIV      Past Medical History:  Diagnosis Date  . Depression    doing ok  . Gonorrhea   . Infection    UTI  . 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 Ovarian cyst   . Pregnancy induced hypertension   . Preterm labor   . Vaginal Pap smear, abnormal    had bx    Past Surgical History:  Procedure Laterality Date  . NO PAST SURGERIES      Current Outpatient Prescriptions on File Prior to Visit  Medication Sig Dispense  Refill  . acetaminophen (TYLENOL) 325 MG tablet Take 650 mg by mouth every 6 (six) hours as needed for mild pain.     No current facility-administered medications on file prior to visit.     Allergies  Allergen Reactions  . Penicillins Hives     Has patient had a PCN reaction causing immediate rash, facial/tongue/throat swelling, SOB or lightheadedness with hypotension: Unknown Has patient had a PCN reaction causing severe rash involving mucus membranes or skin necrosis: Unknown Has patient had a PCN reaction that required hospitalization No Has patient had a PCN reaction occurring within the last 10 years: No If all of the above answers are "NO", then may proceed with Cephalosporin use.     Social History   Social History  . Marital status: Single    Spouse name: N/A  . Number of children: N/A  . Years of education: N/A   Occupational History  . Not on file.   Social History Main Topics  . Smoking status: Current Some Day Smoker    Packs/day: 0.50    Years: 12.00    Types: Cigarettes  . Smokeless tobacco: Never Used  .  Alcohol use No  . Drug use: No     Comment: last was 06/2016  . Sexual activity: Not Currently    Birth control/ protection: None   Other Topics Concern  . Not on file   Social History Narrative  . No narrative on file    Family History  Problem Relation Age of Onset  . Family history unknown: Yes   Smokes - cigarettes 1/2 ppd x 14 years; hx of cocaine Alcohol - none  The following portions of the patient's history were reviewed and updated as appropriate: allergies, current medications, past family history, past medical history, past social history, past surgical history and problem list.  Review of Systems Constitutional: negative for anorexia, chills, fatigue, fevers, malaise and night sweats Respiratory: negative for cough, dyspnea on exertion and wheezing Cardiovascular: negative for chest pain, dyspnea and fatigue Gastrointestinal:  negative for abdominal pain and change in bowel habits Genitourinary:negative for dysuria and frequency   Objective:  BP 112/80   Pulse 93   Ht 5\' 4"  (1.626 m)   Wt 113 lb 4.8 oz (51.4 kg)   LMP 09/21/2016 (Approximate)   BMI 19.45 kg/m  CONSTITUTIONAL: Well-developed, well-nourished female in no acute distress.  HENT:  Normocephalic, atraumatic, External right and left ear normal. Oropharynx is clear and moist EYES: Conjunctivae and EOM are normal. Pupils are equal, round, and reactive to light. No scleral icterus.  NECK: Normal range of motion, supple, no masses.  Normal thyroid.  SKIN: Skin is warm and dry. No rash noted. Not diaphoretic. No erythema. No pallor. NEUROLOGIC: Alert and oriented to person, place, and time. Normal reflexes, muscle tone coordination. No cranial nerve deficit noted. PSYCHIATRIC: Normal mood and affect. Normal behavior. Normal judgment and thought content. CARDIOVASCULAR: Normal heart rate noted, regular rhythm RESPIRATORY: Clear to auscultation bilaterally. Effort and breath sounds normal, no problems with respiration noted. BREASTS: Symmetric in size. No masses, skin changes, nipple drainage, or lymphadenopathy. ABDOMEN: Soft, normal bowel sounds, no distention noted.  No tenderness, rebound or guarding.  PELVIC: Normal appearing external genitalia; normal appearing vaginal mucosa and cervix.  No abnormal discharge noted.  Pap smear obtained.  Normal uterine size, no other palpable masses, no uterine or adnexal tenderness. MUSCULOSKELETAL: Normal range of motion. No tenderness.  No cyanosis, clubbing, or edema.  2+ distal pulses.   Assessment and Plan:  1. 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 Plan to call with PAP results if abnormal. Wants STI testing on PAP and HIV/RPR - Cytology - PAP - Hepatitis B surface antigen - Hepatitis C antibody - HIV antibody - RPR  Birth control  counseling Offered depo, but patient had unprotected sex in the last 2 weeks. Possibly interested in IUD vs Nexplanon, but does not want to schedule a follow up appointment for this. Has not been using condoms regularly.   Poor social situation Patient reports trouble obtaining food, weight loss due to this. Lives in a hotel. CSW to speak with patient in office today.  Will follow up results of pap smear and manage accordingly. Routine preventative health maintenance measures emphasized. Please refer to After Visit Summary for other counseling recommendations.   Loni MuseKate Justice Aguirre, MD

## 2016-10-01 NOTE — Assessment & Plan Note (Addendum)
Offered depo, but patient had unprotected sex in the last 2 weeks. Possibly interested in IUD vs Nexplanon, but does not want to schedule a follow up appointment for this. Has not been using condoms regularly.

## 2016-10-01 NOTE — Assessment & Plan Note (Signed)
Patient reports trouble obtaining food, weight loss due to this. Lives in a hotel. CSW to speak with patient in office today.

## 2016-10-02 LAB — HEPATITIS C ANTIBODY

## 2016-10-02 LAB — HEPATITIS B SURFACE ANTIGEN: Hepatitis B Surface Ag: NEGATIVE

## 2016-10-02 LAB — RPR: RPR Ser Ql: NONREACTIVE

## 2016-10-02 LAB — HIV ANTIBODY (ROUTINE TESTING W REFLEX): HIV Screen 4th Generation wRfx: NONREACTIVE

## 2016-10-06 LAB — CYTOLOGY - PAP
Chlamydia: NEGATIVE
Diagnosis: NEGATIVE
HPV (WINDOPATH): NOT DETECTED
NEISSERIA GONORRHEA: NEGATIVE
Trichomonas: POSITIVE — AB

## 2016-10-07 MED ORDER — METRONIDAZOLE 500 MG PO TABS: 2000.0000 mg | ORAL_TABLET | Freq: Once | ORAL | 2 refills | Status: AC

## 2016-10-07 NOTE — Addendum Note (Signed)
Addended by: Jaynie CollinsANYANWU, Nishant Schrecengost A on: 10/07/2016 11:59 AM   Modules accepted: Orders

## 2016-10-12 ENCOUNTER — Encounter: Payer: Self-pay | Admitting: General Practice

## 2016-10-26 ENCOUNTER — Encounter: Payer: Self-pay | Admitting: General Practice

## 2016-11-09 ENCOUNTER — Other Ambulatory Visit: Payer: Self-pay

## 2017-11-22 ENCOUNTER — Emergency Department (HOSPITAL_COMMUNITY): Payer: Medicaid Other

## 2017-11-22 ENCOUNTER — Other Ambulatory Visit: Payer: Self-pay

## 2017-11-22 ENCOUNTER — Encounter (HOSPITAL_COMMUNITY): Payer: Self-pay | Admitting: Emergency Medicine

## 2017-11-22 ENCOUNTER — Emergency Department (HOSPITAL_COMMUNITY)
Admission: EM | Admit: 2017-11-22 | Discharge: 2017-11-23 | Disposition: A | Discharge status: Home / Self Care | Payer: Medicaid Other | Attending: Emergency Medicine | Admitting: Emergency Medicine

## 2017-11-22 DIAGNOSIS — Z5321 Procedure and treatment not carried out due to patient leaving prior to being seen by health care provider: Secondary | ICD-10-CM | POA: Diagnosis not present

## 2017-11-22 DIAGNOSIS — R05 Cough: Secondary | ICD-10-CM | POA: Diagnosis present

## 2017-11-22 NOTE — ED Notes (Signed)
Bed: WTR5 Expected date:  Expected time:  Means of arrival:  Comments: 

## 2017-11-22 NOTE — ED Notes (Signed)
Called from triage with no answer 

## 2017-11-22 NOTE — ED Triage Notes (Signed)
Patient c/o productive cough and body aches x2 weeks.

## 2018-06-02 ENCOUNTER — Encounter: Payer: Self-pay | Admitting: *Deleted

## 2018-06-11 ENCOUNTER — Encounter (HOSPITAL_COMMUNITY): Payer: Self-pay | Admitting: *Deleted

## 2018-06-11 ENCOUNTER — Inpatient Hospital Stay (HOSPITAL_COMMUNITY)
Admission: AD | Admit: 2018-06-11 | Discharge: 2018-06-12 | Disposition: A | Discharge status: Other Acute Inpatient Hospital | Payer: Medicaid Other | Attending: Obstetrics & Gynecology | Admitting: Obstetrics & Gynecology

## 2018-06-11 ENCOUNTER — Inpatient Hospital Stay (HOSPITAL_COMMUNITY): Payer: Medicaid Other

## 2018-06-11 ENCOUNTER — Other Ambulatory Visit: Payer: Self-pay

## 2018-06-11 DIAGNOSIS — Z3687 Encounter for antenatal screening for uncertain dates: Secondary | ICD-10-CM

## 2018-06-11 DIAGNOSIS — F17210 Nicotine dependence, cigarettes, uncomplicated: Secondary | ICD-10-CM | POA: Insufficient documentation

## 2018-06-11 DIAGNOSIS — O0933 Supervision of pregnancy with insufficient antenatal care, third trimester: Secondary | ICD-10-CM | POA: Diagnosis not present

## 2018-06-11 DIAGNOSIS — O26892 Other specified pregnancy related conditions, second trimester: Secondary | ICD-10-CM | POA: Diagnosis not present

## 2018-06-11 DIAGNOSIS — O42913 Preterm premature rupture of membranes, unspecified as to length of time between rupture and onset of labor, third trimester: Secondary | ICD-10-CM

## 2018-06-11 DIAGNOSIS — O0932 Supervision of pregnancy with insufficient antenatal care, second trimester: Secondary | ICD-10-CM | POA: Insufficient documentation

## 2018-06-11 DIAGNOSIS — O99332 Smoking (tobacco) complicating pregnancy, second trimester: Secondary | ICD-10-CM | POA: Diagnosis not present

## 2018-06-11 DIAGNOSIS — Z363 Encounter for antenatal screening for malformations: Secondary | ICD-10-CM | POA: Diagnosis not present

## 2018-06-11 DIAGNOSIS — Z3A25 25 weeks gestation of pregnancy: Secondary | ICD-10-CM | POA: Insufficient documentation

## 2018-06-11 DIAGNOSIS — Z3A31 31 weeks gestation of pregnancy: Secondary | ICD-10-CM | POA: Diagnosis not present

## 2018-06-11 DIAGNOSIS — Z3A28 28 weeks gestation of pregnancy: Secondary | ICD-10-CM

## 2018-06-11 DIAGNOSIS — O0930 Supervision of pregnancy with insufficient antenatal care, unspecified trimester: Secondary | ICD-10-CM

## 2018-06-11 LAB — WET PREP, GENITAL
Sperm: NONE SEEN
Yeast Wet Prep HPF POC: NONE SEEN

## 2018-06-11 MED ORDER — LACTATED RINGERS IV SOLN
INTRAVENOUS | Status: DC
  Administered 2018-06-11: via INTRAVENOUS

## 2018-06-11 NOTE — MAU Note (Signed)
Water broke ago. Clear fld. No pain. Has not had pnc. Denies any health problems. Thinks LMP 2nd wk of Nov 2019

## 2018-06-12 DIAGNOSIS — Z3A31 31 weeks gestation of pregnancy: Secondary | ICD-10-CM

## 2018-06-12 DIAGNOSIS — O42913 Preterm premature rupture of membranes, unspecified as to length of time between rupture and onset of labor, third trimester: Secondary | ICD-10-CM

## 2018-06-12 LAB — CBC
HCT: 31.1 % — ABNORMAL LOW (ref 36.0–46.0)
Hemoglobin: 10.6 g/dL — ABNORMAL LOW (ref 12.0–15.0)
MCH: 32.2 pg (ref 26.0–34.0)
MCHC: 34.1 g/dL (ref 30.0–36.0)
MCV: 94.5 fL (ref 80.0–100.0)
Platelets: 217 10*3/uL (ref 150–400)
RBC: 3.29 MIL/uL — ABNORMAL LOW (ref 3.87–5.11)
RDW: 13.6 % (ref 11.5–15.5)
WBC: 16.9 10*3/uL — ABNORMAL HIGH (ref 4.0–10.5)
nRBC: 0 % (ref 0.0–0.2)

## 2018-06-12 LAB — TYPE AND SCREEN
ABO/RH(D): O POS
Antibody Screen: NEGATIVE

## 2018-06-12 LAB — HIV ANTIBODY (ROUTINE TESTING W REFLEX): HIV Screen 4th Generation wRfx: NONREACTIVE

## 2018-06-12 LAB — RPR: RPR Ser Ql: NONREACTIVE

## 2018-06-12 MED ORDER — BETAMETHASONE SOD PHOS & ACET 6 (3-3) MG/ML IJ SUSP
12.0000 mg | INTRAMUSCULAR | Status: DC
  Administered 2018-06-12: 12 mg via INTRAMUSCULAR
  Filled 2018-06-12: qty 2

## 2018-06-12 NOTE — H&P (Signed)
Pamela [name] is a 33 y.o. female presenting for ROM around 9:45pm today. She denies abdominal pain, decreased fetal movement, and vaginal bleeding. 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 She has not received prenatal care or had treatment in [facility]U for ob complications prior to this evening.  OB History    Gravida  7   Para  6   Term  2   Preterm  4   AB      Living  6     SAB      TAB      Ectopic      Multiple  0   Live Births  6          Past Medical History:  Diagnosis Date  . Depression    doing ok  . Gonorrhea   . Infection    UTI  . Myocardial infarction [facility])    pt stated age 56 had MI as a result of cocaine use  . Ovarian cyst   . Pregnancy induced hypertension   . Preterm labor   . Vaginal Pap smear, abnormal    had bx   Past Surgical History:  Procedure Laterality Date  . NO PAST SURGERIES     Family History: Family history is unknown by patient. Social History:  reports that she has been smoking cigarettes. She has a 6.00 pack-year smoking history. She has never used smokeless tobacco. She reports that she does not drink alcohol or use drugs.  Review of Systems  Constitutional: Negative for chills and fever.  Respiratory: Negative for shortness of breath.   Gastrointestinal: Negative for abdominal pain.  Musculoskeletal: Negative for back pain.  Neurological: Negative for headaches.  All other systems reviewed and are negative.  Dilation: Closed(visually) Blood pressure 123/80, pulse (!) 110, temperature 98.9 F (37.2 C), resp. rate 18, height 5\' 4"  (1.626 m), weight 55.8 kg, last menstrual period 12/13/2017, not currently breastfeeding.    Physical Exam  Nursing note and vitals reviewed. Constitutional: She is oriented to person, place, and time. She appears well-developed and well-nourished.  Cardiovascular: Normal rate.  Respiratory: Effort normal and  breath sounds normal.  GI: There is no abdominal tenderness. There is no rebound and no guarding.  Gravid  Genitourinary:    Vaginal discharge present.     Genitourinary Comments: Fundal height 32   Musculoskeletal: Normal range of motion.  Neurological: She is alert and oriented to person, place, and time.  Skin: Skin is warm and dry.  Psychiatric: She has a normal mood and affect. Her behavior is normal. Thought content normal.    Prenatal labs: ordered, in work ABO, Rh:   Antibody:   RPR:    HIV:     Results for orders placed or performed during the hospital encounter of 06/11/18 (from the past 24 hour(s))  Wet prep, genital     Status: Abnormal   Collection Time: 06/11/18 10:55 PM  Result Value Ref Range   Yeast Wet Prep HPF POC NONE SEEN NONE SEEN   Trich, Wet Prep PRESENT (A) NONE SEEN   Clue Cells Wet Prep HPF POC PRESENT (A) NONE SEEN   WBC, Wet Prep HPF POC FEW (A) NONE SEEN   Sperm NONE SEEN     Assessment/Plan: --33 y.o. [id number] at [redacted]w[redacted]d by LMP c/b no prenatal care, history of preterm delivery and cocaine use --S/p bedside OB limited in MAU (see Media tab) --Grossly  ruptured, positive pooling, positive fern --Cervix closed on speculum exam, confirmed with sterile manual exam --Reactive tracing: baseline  --NICU full per consult with Dr. Eulah PontMurphy --Discussed with Dr. Debroah LoopArnold --[facility] Dr. Frazier RichardsShepherd accepting physician  Calvert CantorSamantha C Weinhold, CNM 06/12/2018, 12:09 AM

## 2018-06-12 NOTE — MAU Note (Signed)
[facility] transport team at bedside. FHR 165. Pt denies pain or contractions. Pt belongings placed in bag with pt label on placed on bag and given to transport team.

## 2018-06-13 LAB — GC/CHLAMYDIA PROBE AMP (~~LOC~~) NOT AT ARMC
Chlamydia: NEGATIVE
Neisseria Gonorrhea: NEGATIVE

## 2018-06-13 LAB — ABO/RH: ABO/RH(D): O POS

## 2018-06-18 ENCOUNTER — Inpatient Hospital Stay (HOSPITAL_COMMUNITY)
Admission: AD | Admit: 2018-06-18 | Discharge: 2018-06-18 | Disposition: A | Discharge status: Home / Self Care | Payer: Medicaid Other | Attending: Obstetrics and Gynecology | Admitting: Obstetrics and Gynecology

## 2018-06-18 ENCOUNTER — Other Ambulatory Visit: Payer: Self-pay

## 2018-06-18 DIAGNOSIS — O42912 Preterm premature rupture of membranes, unspecified as to length of time between rupture and onset of labor, second trimester: Secondary | ICD-10-CM | POA: Diagnosis not present

## 2018-06-18 DIAGNOSIS — O4102X0 Oligohydramnios, second trimester, not applicable or unspecified: Secondary | ICD-10-CM | POA: Insufficient documentation

## 2018-06-18 DIAGNOSIS — O0932 Supervision of pregnancy with insufficient antenatal care, second trimester: Secondary | ICD-10-CM | POA: Diagnosis not present

## 2018-06-18 DIAGNOSIS — Z3A26 26 weeks gestation of pregnancy: Secondary | ICD-10-CM

## 2018-06-18 DIAGNOSIS — F17210 Nicotine dependence, cigarettes, uncomplicated: Secondary | ICD-10-CM | POA: Insufficient documentation

## 2018-06-18 DIAGNOSIS — O42919 Preterm premature rupture of membranes, unspecified as to length of time between rupture and onset of labor, unspecified trimester: Secondary | ICD-10-CM

## 2018-06-18 DIAGNOSIS — O99332 Smoking (tobacco) complicating pregnancy, second trimester: Secondary | ICD-10-CM | POA: Diagnosis not present

## 2018-06-18 DIAGNOSIS — O26893 Other specified pregnancy related conditions, third trimester: Secondary | ICD-10-CM | POA: Diagnosis present

## 2018-06-18 LAB — URINALYSIS, ROUTINE W REFLEX MICROSCOPIC
Bilirubin Urine: NEGATIVE
Glucose, UA: NEGATIVE mg/dL
Ketones, ur: NEGATIVE mg/dL
Nitrite: NEGATIVE
Protein, ur: NEGATIVE mg/dL
Specific Gravity, Urine: 1.021 (ref 1.005–1.030)
pH: 5 (ref 5.0–8.0)

## 2018-06-18 MED ORDER — TERBUTALINE SULFATE 1 MG/ML IJ SOLN
INTRAMUSCULAR | Status: AC
  Filled 2018-06-18: qty 1

## 2018-06-18 MED ORDER — PROMETHAZINE HCL 25 MG/ML IJ SOLN
12.5000 mg | Freq: Once | INTRAMUSCULAR | Status: AC
  Administered 2018-06-18: 14:00:00 12.5 mg via INTRAVENOUS
  Filled 2018-06-18: qty 1

## 2018-06-18 MED ORDER — NALBUPHINE HCL 10 MG/ML IJ SOLN
10.0000 mg | Freq: Once | INTRAMUSCULAR | Status: AC
  Administered 2018-06-18: 10 mg via INTRAVENOUS
  Filled 2018-06-18: qty 1

## 2018-06-18 MED ORDER — LACTATED RINGERS IV SOLN
INTRAVENOUS | Status: DC
  Administered 2018-06-18: 13:00:00 via INTRAVENOUS

## 2018-06-18 MED ORDER — FENTANYL CITRATE (PF) 100 MCG/2ML IJ SOLN
50.0000 ug | Freq: Once | INTRAMUSCULAR | Status: AC
  Administered 2018-06-18: 50 ug via INTRAVENOUS
  Filled 2018-06-18: qty 2

## 2018-06-18 MED ORDER — TERBUTALINE SULFATE 1 MG/ML IJ SOLN
0.2500 mg | Freq: Once | INTRAMUSCULAR | Status: AC
  Administered 2018-06-18: 0.25 mg via SUBCUTANEOUS

## 2018-06-18 MED ORDER — MAGNESIUM SULFATE BOLUS VIA INFUSION
4.0000 g | Freq: Once | INTRAVENOUS | Status: AC
  Administered 2018-06-18: 4 g via INTRAVENOUS
  Filled 2018-06-18: qty 500

## 2018-06-18 MED ORDER — MAGNESIUM SULFATE 40 G IN LACTATED RINGERS - SIMPLE
2.0000 g/h | INTRAVENOUS | Status: DC
  Filled 2018-06-18: qty 500

## 2018-06-18 NOTE — MAU Note (Signed)
Patient left with [facility] transport. All belongings sent with patient.

## 2018-06-18 NOTE — MAU Note (Signed)
Pamela [name] is a 33 y.o. at [redacted]w[redacted]d here in MAU reporting: contractions since last night, they are worse today, coming every 6 min. Some bleeding, states SROM on 06/11/18.  Onset of complaint: last night  Pain score: 7/10  Vitals:   06/18/18 1218  BP: 109/72  Pulse: (!) 116  Resp: 18  Temp: 97.9 F (36.6 C)  SpO2: 99%     FHT: + FM  Lab orders placed from triage: UA

## 2018-06-18 NOTE — MAU Note (Signed)
Report called to Charge at [facility] LD triage.

## 2018-06-18 NOTE — H&P (Signed)
HPI: Pamela [name] is a 33 y.o. [phone] at [redacted]w[redacted]d who presents to maternity admissions reporting contractions. Contractions more intense since last night. Was admitted & transferred to [facility] on 5/10 d/t PPROM. Signed out AMA the next day because they "would let her go outside to smoke". Denies vaginal bleeding. Normal fetal movement.  Location: abdominal  Quality: contractions Severity: 7/10 in pain scale Duration: 1 day Timing: intermittent Modifying factors: none Associated signs and symptoms: LOF  Pregnancy Course: no prenatal care. PPROM on 5/10. BMZ on 5/10, unknown if completed series.   Past Medical History: Past Medical History:  Diagnosis Date  . Depression    doing ok  . Gonorrhea   . Infection    UTI  . 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 Ovarian cyst   . Pregnancy induced hypertension   . Preterm labor   . Vaginal Pap smear, abnormal    had bx    Past Surgical History: Past Surgical History:  Procedure Laterality Date  . NO PAST SURGERIES      Obstetrical History: OB History    Gravida  7   Para  6   Term  2   Preterm  4   AB      Living  6     SAB      TAB      Ectopic      Multiple  0   Live Births  6           Social History: Social History   Socioeconomic History  . Marital status: Single    Spouse name: Not on file  . Number of children: Not on file  . Years of education: Not on file  . Highest education level: Not on file  Occupational History  . Not on file  Social Needs  . Financial resource strain: Not on file  . Food insecurity:    Worry: Not on file    Inability: Not on file  . Transportation needs:    Medical: Not on file    Non-medical: Not on file  Tobacco Use  . Smoking status: Current Some Day Smoker    Packs/day: 0.50    Years: 12.00    Pack years: 6.00    Types: Cigarettes  . Smokeless tobacco: Never Used  Substance and Sexual Activity  .  Alcohol use: No  . Drug use: No    Types: Cocaine    Comment: last used cocaine a month ago  . Sexual activity: Not Currently    Birth control/protection: None  Lifestyle  . Physical activity:    Days per week: Not on file    Minutes per session: Not on file  . Stress: Not on file  Relationships  . Social connections:    Talks on phone: Not on file    Gets together: Not on file    Attends religious service: Not on file    Active member of club or organization: Not on file    Attends meetings of clubs or organizations: Not on file    Relationship status: Not on file  Other Topics Concern  . Not on file  Social History Narrative  . Not on file    Family History: Family History  Family history unknown: Yes    Allergies: Allergies  Allergen Reactions  . Penicillins Hives     Has patient had a PCN reaction causing  immediate rash, facial/tongue/throat swelling, SOB or lightheadedness with hypotension: Unknown Has patient had a PCN reaction causing severe rash involving mucus membranes or skin necrosis: Unknown Has patient had a PCN reaction that required hospitalization No Has patient had a PCN reaction occurring within the last 10 years: No If all of the above answers are "NO", then may proceed with Cephalosporin use.     Medications Prior to Admission  Medication Sig Dispense Refill Last Dose  . acetaminophen (TYLENOL) 325 MG tablet Take 650 mg by mouth every 6 (six) hours as needed for mild pain.   Past Week at Unknown time  . Prenatal Vit-Fe Fumarate-FA (MULTIVITAMIN-PRENATAL) 27-0.8 MG TABS tablet Take 1 tablet by mouth daily at 12 noon.   Past Month at Unknown time   ROS:  Review of Systems  Constitutional: Negative.   Gastrointestinal: Positive for abdominal pain.  Genitourinary: Negative.    Physical examination   All systems reviewed and negative except as stated in HPI Constitutional: Well-developed, well-nourished female in no acute distress.   Cardiovascular: normal rate & rhythm, no murmur Respiratory: normal effort, lung sounds clear throughout GI: Abd soft, non-tender, gravid appropriate for gestational age. Pos BS x 4 MS: Extremities nontender, no edema, normal ROM Neurologic: Alert and oriented x 4.  GU:      Dilation: 1 Effacement (%): 80 Station: -2 Exam by:: erin lawrence np  NST:  Baseline: 150 bpm, Variability: Good {> 6 bpm), Accelerations: Non-reactive but appropriate for gestational age and Decelerations: Variable: mild  Prenatal labs: ABO, Rh: --/--/O POS, O POS (05/09 2359) Antibody: NEG (05/09 2359) Rubella:   RPR: Non Reactive (05/10 0001)  HBsAg:    HIV: Non Reactive (05/10 0001)  GC/Chlamydia: Negative (5/10)  Prenatal Transfer Tool  Maternal Diabetes: No Genetic Screening: No prenatal care Maternal Ultrasounds/Referrals: Abnormal:  Findings:   Other: Oligohydramnios  Fetal Ultrasounds or other Referrals:  None Maternal Substance Abuse:  Yes:  Type: Cocaine Significant Maternal Medications:  None Significant Maternal Lab Results: None  Results for orders placed or performed during the hospital encounter of 06/18/18 (from the past 24 hour(s))  Urinalysis, Routine w reflex microscopic   Collection Time: 06/18/18 12:35 PM  Result Value Ref Range   Color, Urine YELLOW YELLOW   APPearance HAZY (A) CLEAR   Specific Gravity, Urine 1.021 1.005 - 1.030   pH 5.0 5.0 - 8.0   Glucose, UA NEGATIVE NEGATIVE mg/dL   Hgb urine dipstick SMALL (A) NEGATIVE   Bilirubin Urine NEGATIVE NEGATIVE   Ketones, ur NEGATIVE NEGATIVE mg/dL   Protein, ur NEGATIVE NEGATIVE mg/dL   Nitrite NEGATIVE NEGATIVE   Leukocytes,Ua MODERATE (A) NEGATIVE   RBC / HPF 11-20 0 - 5 RBC/hpf   WBC, UA 21-50 0 - 5 WBC/hpf   Bacteria, UA RARE (A) NONE SEEN   Squamous Epithelial / LPF 11-20 0 - 5   Mucus PRESENT     Patient Active Problem List   Diagnosis Date Noted  . Birth control counseling 10/01/2016  . Poor social situation  10/01/2016  . Cocaine abuse affecting pregnancy in third trimester (HCC) 06/17/2016  . Labor and delivery, indication for care 06/17/2016  . Preterm premature rupture of membranes (PPROM) with unknown onset of labor 06/13/2016  . No prenatal care in current pregnancy 06/13/2016  . Severe dysplasia of cervix (CIN III) 11/22/2014   [place] Mfm Ob Comp + 14 Wk  Result Date: 06/12/2018 ----------------------------------------------------------------------  OBSTETRICS REPORT                        (  Signed Final 06/12/2018 09:26 am) ---------------------------------------------------------------------- Patient Info  ID #:       [id number]                          D.O.B.:  28-Jan-1986 (32 yrs)  Name:       Pamela [name]               Visit Date: 06/11/2018 11:31 pm ---------------------------------------------------------------------- Performed By  Performed By:     Earley Brooke     Secondary Phy.:    MAU Nursing-                    BS, RDMS                                                              MAU/Triage  Attending:        Noralee Space MD        [facility]  Referred By:      Calvert Cantor ---------------------------------------------------------------------- Orders   #  Description                          Code         Ordered By   1  [place] MFM OB COMP + 14 WK               78295.62     Clayton Bibles  ----------------------------------------------------------------------   #  Order #                    Accession #                 Episode #   1  [id number]                  [id number]                  [id number]  ---------------------------------------------------------------------- Indications   Insufficient Prenatal Care (None)              O09.30   [redacted] weeks gestation of pregnancy                Z3A.28   Premature rupture of  membranes - leaking       O42.90   fluid   Encounter for uncertain dates  Z36.87  ---------------------------------------------------------------------- Fetal Evaluation  Num Of Fetuses:          1  Fetal Heart Rate(bpm):   159  Cardiac Activity:        Observed  Presentation:            Cephalic  Placenta:                Anterior  P. Cord Insertion:       Visualized  Amniotic Fluid  AFI FV:      Oligohydramnios secondary to ROM  AFI Sum(cm)     %Tile       Largest Pocket(cm)  4.54            < 3         2.12  RUQ(cm)       RLQ(cm)       LUQ(cm)        LLQ(cm)  1.4           0             1.02           2.12 ---------------------------------------------------------------------- Biometry  BPD:      72.1  mm     G. Age:  29w 0d         45  %    CI:        68.23   %    70 - 86                                                          FL/HC:       17.0  %    19.6 - 20.8  HC:      279.1  mm     G. Age:  30w 4d         73  %    HC/AC:       1.12       0.99 - 1.21  AC:      250.1  mm     G. Age:  29w 1d         59  %    FL/BPD:      65.7  %    71 - 87  FL:       47.4  mm     G. Age:  25w 6d        < 3  %    FL/AC:       19.0  %    20 - 24  HUM:      44.6  mm     G. Age:  26w 3d        < 5  %  CER:      37.8  mm     G. Age:  32w 3d       > 95  %  LV:          4  mm  CM:        8.6  mm  Est. FW:    1191   gm   2 lb 10 oz      41  % ---------------------------------------------------------------------- OB History  Gravidity:    7         Term:   2  Prem:   4        SAB:   0  TOP:          0       Ectopic:  0        Living: 6 ---------------------------------------------------------------------- Gestational Age  U/S Today:     28w 5d                                        EDD:   08/29/18  Best:          28w 5d     Det. By:  U/S (06/11/18)           EDD:   08/29/18 ---------------------------------------------------------------------- Anatomy  Cranium:               Appears normal         LVOT:                    Appears normal  Cavum:                 Appears normal         Aortic Arch:            Not well visualized  Ventricles:            Appears normal         Ductal Arch:            Not well visualized  Choroid Plexus:        Not well visualized    Diaphragm:              Appears normal  Cerebellum:            Appears normal         Stomach:                Appears normal, left                                                                        sided  Posterior Fossa:       Not well visualized    Abdomen:                Not well visualized  Nuchal Fold:           Not applicable (>20    Abdominal Wall:         Not well visualized                         wks GA)  Face:                  Appears normal         Cord Vessels:           Appears normal (3                         (orbits and profile)  vessel cord)  Lips:                  Appears normal         Kidneys:                Appear normal  Palate:                Not well visualized    Bladder:                Appears normal  Thoracic:              Appears normal         Spine:                  Not well visualized  Heart:                 Appears normal         Upper Extremities:      Present                         (4CH, axis, and                         situs)  RVOT:                  Not well visualized    Lower Extremities:      Present  Other:  Technically difficult due to low amniotic fluid and fetal position. ---------------------------------------------------------------------- Cervix Uterus Adnexa  Cervix  Not visualized (advanced GA >24wks) ---------------------------------------------------------------------- Impression  Patient was evaluated for c/o leakage of amniotic fluid.  Insufficient prenatal care.  On ultrasound, oligohydramnios is seen that is consistent  with the clinical diagnosis of PPROM. Fetal biometry is  consistent with 28w 5d.  Fetal anatomical survey is limited  because of advanced gestational age and  oligohydramnios.  [facility].  We have assigned the EDD at 08/29/2018 based on today 's  ultrasound. ----------------------------------------------------------------------                  Noralee Spaceavi Shankar, MD Electronically Signed Final Report   06/12/2018 09:26 am ----------------------------------------------------------------------  MDM: I was called directly to patient's room as patient said "the baby is coming". Cervix is 0.5-1/80/-2. Pt appears comfortable after exam.   Pt is known PPROM & now complaining of ctx. NICU remains closed. [facility] & pt is agreeable at this time.    [facility] Ceasar Lundrone [facility] per Dr. Alysia PennaErvin  1330: Pain given 50mcg of IV Fentanyl   Reassessment at 1400:  Patient reports increased contraction intensity after initiating transfer, cervix rechecked and noted to be 1.5/90/-2  1415: Terbutaline 0.25mg  given in addition to pain medication of nubain with phenergan.   Patient reassessed prior to transport. Patient comfortable. Patient stable for transfer.   Assessment: Pamela [name] is a 33 y.o. G7P2406 at 2642w5d being admitted for preterm premature rupture of membranes with unknown onset of labor.   [facility].   Sharyon Cableogers, Vy Badley C, CNM 06/18/2018, 2:47 PM

## 2018-06-18 NOTE — MAU Provider Note (Deleted)
Chief Complaint:  Contractions   First Provider Initiated Contact with Patient 06/18/18 1230     HPI: Pamela [name] is a 33 y.o. G7P2406 at 5869w5d who presents to maternity admissions reporting contractions. Contractions more intense since last night. [facility] d/t PPROM. Signed out AMA the next day because they "would let her go outside to smoke". Denies vaginal bleeding. Normal fetal movement.  Location: abdominal  Quality: contractions Severity: 7/10 in pain scale Duration: 1 day Timing: intermittent Modifying factors: none Associated signs and symptoms: LOF  Pregnancy Course: no prenatal care. PPROM on 5/10. BMZ on 5/10, unknown if completed series.   Past Medical History:  Diagnosis Date  . Depression    doing ok  . Gonorrhea   . Infection    UTI  . Myocardial infarction [facility](HCC)    pt stated age 33 had MI as a result of cocaine use  . Ovarian cyst   . Pregnancy induced hypertension   . Preterm labor   . Vaginal Pap smear, abnormal    had bx   OB History  Gravida Para Term Preterm AB Living  7 6 2 4   6   SAB TAB Ectopic Multiple Live Births        0 6    # Outcome Date GA Lbr Len/2nd Weight Sex Delivery Anes PTL Lv  7 Current           6 Preterm 06/17/16 10076w1d 10:13 / 00:36 2310 g F Vag-Spont EPI  LIV     Birth Comments: premature; cyanotic transferred to NICU  5 Preterm 07/03/14  02:50 / 00:10 1640 g F Vag-Spont None  LIV     Complications: Preterm labor  4 Preterm 07/25/13 297w4d  3033 g F Vag-Spont None  LIV     Birth Comments: *apgars assigned by EMS to be 8/9*  3 Preterm 11/2009 444w0d   M Vag-Spont EPI N LIV  2 Term 07/2008 8068w0d   M Vag-Spont EPI N LIV  1 Term 2004 6868w0d   F Vag-Spont EPI N LIV   Past Surgical History:  Procedure Laterality Date  . NO PAST SURGERIES     Family History  Family history unknown: Yes   Social History   Tobacco Use  . Smoking status: Current Some Day Smoker    Packs/day: 0.50    Years:  12.00    Pack years: 6.00    Types: Cigarettes  . Smokeless tobacco: Never Used  Substance Use Topics  . Alcohol use: No  . Drug use: No    Types: Cocaine    Comment: last used cocaine a month ago   Allergies  Allergen Reactions  . Penicillins Hives     Has patient had a PCN reaction causing immediate rash, facial/tongue/throat swelling, SOB or lightheadedness with hypotension: Unknown Has patient had a PCN reaction causing severe rash involving mucus membranes or skin necrosis: Unknown Has patient had a PCN reaction that required hospitalization No Has patient had a PCN reaction occurring within the last 10 years: No If all of the above answers are "NO", then may proceed with Cephalosporin use.    Medications Prior to Admission  Medication Sig Dispense Refill Last Dose  . acetaminophen (TYLENOL) 325 MG tablet Take 650 mg by mouth every 6 (six) hours as needed for mild pain.   Past Week at Unknown time  . Prenatal Vit-Fe Fumarate-FA (MULTIVITAMIN-PRENATAL) 27-0.8 MG TABS tablet Take 1 tablet by mouth daily at  12 noon.   Past Month at Unknown time    I have reviewed patient's Past Medical Hx, Surgical Hx, Family Hx, Social Hx, medications and allergies.   ROS:  Review of Systems  Constitutional: Negative.   Gastrointestinal: Positive for abdominal pain.  Genitourinary: Negative.     Physical Exam   Patient Vitals for the past 24 hrs:  BP Temp Temp src Pulse Resp SpO2 Height Weight  06/18/18 1218 109/72 97.9 F (36.6 C) Oral (!) 116 18 99 % - -  06/18/18 1215 - - - - - - 5\' 4"  (1.626 m) 55.5 kg    Constitutional: Well-developed, well-nourished female in no acute distress.  Cardiovascular: normal rate & rhythm, no murmur Respiratory: normal effort, lung sounds clear throughout GI: Abd soft, non-tender, gravid appropriate for gestational age. Pos BS x 4 MS: Extremities nontender, no edema, normal ROM Neurologic: Alert and oriented x 4.  GU:   Dilation: 1 Effacement  (%): 80 Station: -2 Exam by:: Emmanuella Mirante np  NST:  Baseline: 150 bpm, Variability: Good {> 6 bpm), Accelerations: Non-reactive but appropriate for gestational age and Decelerations: Variable: mild   Labs: No results found for this or any previous visit (from the past 24 hour(s)).  Imaging:  No results found.  MAU Course: Orders Placed This Encounter  Procedures  . Urinalysis, Routine w reflex microscopic   No orders of the defined types were placed in this encounter.   MDM: I was called directly to patient's room as patient said "the baby is coming". Cervix is 0.5-1/80/-2. Pt appears comfortable after exam.   Pt is known PPROM & now complaining of ctx. NICU remains closed. Will transfer back to [facility] & pt is agreeable at this time.   Dr. Ceasar Lund accepting physician at [facility] per Dr. Alysia Penna  Assessment: 1. Preterm premature rupture of membranes (PPROM) with unknown onset of labor     Plan: Transfer to [facility]   Judeth Horn, NP 06/18/2018 12:46 PM

## 2018-06-18 NOTE — H&P (Signed)
Pamela [name] is a 33 y.o. female [id number] IUP 26 5/7 weeks with presenting PROM.and pain. Pt with known PROM on 06/12/18. Was transferred to [facility] d/t to our NICU being full. Pt left AMA on 06/13/18.  Today note some increase pain and some spotting.  OB History    Gravida  7   Para  6   Term  2   Preterm  4   AB      Living  6     SAB      TAB      Ectopic      Multiple  0   Live Births  6          Past Medical History:  Diagnosis Date  . Depression    doing ok  . Gonorrhea   . Infection    UTI  . Myocardial infarction [facility])    pt stated age 69 had MI as a result of cocaine use  . Ovarian cyst   . Pregnancy induced hypertension   . Preterm labor   . Vaginal Pap smear, abnormal    had bx   Past Surgical History:  Procedure Laterality Date  . NO PAST SURGERIES     Family History: Family history is unknown by patient. Social History:  reports that she has been smoking cigarettes. She has a 6.00 pack-year smoking history. She has never used smokeless tobacco. She reports that she does not drink alcohol or use drugs.     ROS History Dilation: Fingertip Effacement (%): 80 Station: -2 Exam by:: erin lawrence np Blood pressure 109/72, pulse (!) 116, temperature 97.9 F (36.6 C), temperature source Oral, resp. rate 18, height 5\' 4"  (1.626 m), weight 55.5 kg, last menstrual period 12/13/2017, SpO2 99 %, not currently breastfeeding. Exam Physical Exam  AF VSS Lungs clear Heart RRR Abd soft + BS gravid Cervical as noted  Prenatal labs: ABO, Rh: --/--/O POS, O POS (05/09 2359) Antibody: NEG (05/09 2359) Rubella:   RPR: Non Reactive (05/10 0001)  HBsAg:    HIV: Non Reactive (05/10 0001)  GBS:     NICU remains full. Discussed with NICU attending and requested we transfer to [facility]. Discussed with Dr Ceasar Lund at [facility] who accepted pt in transfer After discussion with Dr Ceasar Lund, pt started having ut ctx. She was given IV pain medications, Sq terb and magnesium  started. Cervical change to 1 cm. Still no evidence of active labor and still feel pt is stable to continue with transfer.   Assessment/Plan: IUP 26 5/7 PROM  Discussed transfer to [facility] with pt. R/B of transfer reviewed. Pt verbalized understanding and agrees to transfer.   Hermina Staggers 06/18/2018, 1:37 PM

## 2018-12-12 ENCOUNTER — Encounter (HOSPITAL_COMMUNITY): Payer: Self-pay

## 2021-03-06 ENCOUNTER — Encounter: Payer: Medicaid Other | Admitting: Obstetrics and Gynecology

## 2021-03-06 ENCOUNTER — Ambulatory Visit (INDEPENDENT_AMBULATORY_CARE_PROVIDER_SITE_OTHER): Payer: Medicaid Other | Admitting: Obstetrics

## 2021-03-06 ENCOUNTER — Other Ambulatory Visit: Payer: Self-pay

## 2021-03-06 ENCOUNTER — Other Ambulatory Visit (HOSPITAL_COMMUNITY)
Admission: RE | Admit: 2021-03-06 | Discharge: 2021-03-06 | Disposition: A | Discharge status: Home / Self Care | Payer: Medicaid Other | Source: Ambulatory Visit | Attending: Obstetrics | Admitting: Obstetrics

## 2021-03-06 ENCOUNTER — Encounter: Payer: Self-pay | Admitting: Obstetrics

## 2021-03-06 VITALS — BP 101/67 | HR 96 | Ht 64.0 in | Wt 116.9 lb

## 2021-03-06 DIAGNOSIS — Z113 Encounter for screening for infections with a predominantly sexual mode of transmission: Secondary | ICD-10-CM

## 2021-03-06 DIAGNOSIS — N898 Other specified noninflammatory disorders of vagina: Secondary | ICD-10-CM | POA: Insufficient documentation

## 2021-03-06 DIAGNOSIS — Z01419 Encounter for gynecological examination (general) (routine) without abnormal findings: Secondary | ICD-10-CM | POA: Diagnosis present

## 2021-03-06 DIAGNOSIS — Z3046 Encounter for surveillance of implantable subdermal contraceptive: Secondary | ICD-10-CM

## 2021-03-06 MED ORDER — METRONIDAZOLE 500 MG PO TABS: 500.0000 mg | ORAL_TABLET | Freq: Two times a day (BID) | ORAL | 2 refills | Status: DC

## 2021-03-06 NOTE — Progress Notes (Addendum)
Subjective:        Pamela [name] is a 36 y.o. female here for a routine exam.  Current complaints: Vaginal discharge with odor.    Personal health questionnaire:  Is patient Ashkenazi Jewish, have a family history of breast and/or ovarian cancer: no Is there a family history of uterine cancer diagnosed at age < 58, gastrointestinal cancer, urinary tract cancer, family member who is a Personnel officer syndrome-associated carrier: no Is the patient overweight and hypertensive, family history of diabetes, personal history of gestational diabetes, preeclampsia or PCOS: no Is patient over 52, have PCOS,  family history of premature CHD under age 40, diabetes, smoke, have hypertension or peripheral artery disease:  no At any time, has a partner hit, kicked or otherwise hurt or frightened you?: no Over the past 2 weeks, have you felt down, depressed or hopeless?: no Over the past 2 weeks, have you felt little interest or pleasure in doing things?:no   Gynecologic History Patient's last menstrual period was 02/19/2021. Contraception: Nexplanon Last Pap: 10-01-2016. Results were: normal Last mammogram: n/a. Results were: n/a  Obstetric History OB History  Gravida Para Term Preterm AB Living  7 6 2 4   7   SAB IAB Ectopic Multiple Live Births        0 7    # Outcome Date GA Lbr Len/2nd Weight Sex Delivery Anes PTL Lv  7 Preterm 06/17/16 [redacted]w[redacted]d 10:13 / 00:36 5 lb 1.5 oz (2.31 kg) F Vag-Spont EPI  LIV     Birth Comments: premature; cyanotic transferred to NICU  6 Preterm 07/03/14  02:50 / 00:10 3 lb 9.9 oz (1.64 kg) F Vag-Spont None  LIV     Complications: Preterm labor  5 Preterm 07/25/13 [redacted]w[redacted]d  6 lb 11 oz (3.033 kg) F Vag-Spont None  LIV     Birth Comments: *apgars assigned by EMS to be 8/9*  4 Preterm 11/2009 [redacted]w[redacted]d   M Vag-Spont EPI N LIV  3 Term 07/2008 [redacted]w[redacted]d   M Vag-Spont EPI N LIV  2 Term 2004 [redacted]w[redacted]d   F Vag-Spont EPI N LIV  1 Gravida             Past Medical History:  Diagnosis Date    Depression    doing ok   Gonorrhea    Infection    UTI   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   Ovarian cyst    Pregnancy induced hypertension    Preterm labor    Vaginal Pap smear, abnormal    had bx    Past Surgical History:  Procedure Laterality Date   NO PAST SURGERIES       Current Outpatient Medications:    acetaminophen (TYLENOL) 325 MG tablet, Take 650 mg by mouth every 6 (six) hours as needed for mild pain., Disp: , Rfl:    metroNIDAZOLE (FLAGYL) 500 MG tablet, Take 1 tablet (500 mg total) by mouth 2 (two) times daily., Disp: 14 tablet, Rfl: 2 Allergies  Allergen Reactions   Penicillins Hives     Has patient had a PCN reaction causing immediate rash, facial/tongue/throat swelling, SOB or lightheadedness with hypotension: Unknown Has patient had a PCN reaction causing severe rash involving mucus membranes or skin necrosis: Unknown Has patient had a PCN reaction that required hospitalization No Has patient had a PCN reaction occurring within the last 10 years: No If all of the above answers are "NO", then  may proceed with Cephalosporin use.     Social History   Tobacco Use   Smoking status: Some Days    Packs/day: 1.00    Years: 12.00    Pack years: 12.00    Types: Cigarettes   Smokeless tobacco: Never  Substance Use Topics   Alcohol use: No    Family History  Family history unknown: Yes      Review of Systems  Constitutional: negative for fatigue and weight loss Respiratory: negative for cough and wheezing Cardiovascular: negative for chest pain, fatigue and palpitations Gastrointestinal: negative for abdominal pain and change in bowel habits Musculoskeletal:negative for myalgias Neurological: negative for gait problems and tremors Behavioral/Psych: negative for abusive relationship, depression Endocrine: negative for temperature intolerance    Genitourinary: positive for vaginal discharge with odor.   negative for abnormal menstrual periods, genital lesions, hot flashes, sexual problems  Integument/breast: negative for breast lump, breast tenderness, nipple discharge and skin lesion(s)    Objective:       BP 101/67    Pulse 96    Ht 5\' 4"  (1.626 m)    Wt 116 lb 14.4 oz (53 kg)    LMP 02/19/2021    BMI 20.07 kg/m  General:   Alert and no distress  Skin:   no rash or abnormalities  Lungs:   clear to auscultation bilaterally  Heart:   regular rate and rhythm, S1, S2 normal, no murmur, click, rub or gallop  Breasts:   normal without suspicious masses, skin or nipple changes or axillary nodes  Abdomen:  normal findings: no organomegaly, soft, non-tender and no hernia  Pelvis:  External genitalia: normal general appearance Urinary system: urethral meatus normal and bladder without fullness, nontender Vaginal: normal without tenderness, induration or masses Cervix: normal appearance Adnexa: normal bimanual exam Uterus: anteverted and non-tender, normal size   Lab Review Urine pregnancy test Labs reviewed yes Radiologic studies reviewed no  I have spent a total of 20 minutes of face-to-face time, excluding clinical staff time, reviewing notes and preparing to see patient, ordering tests and/or medications, and counseling the patient.   Assessment:    1. Encounter for gynecological examination with Papanicolaou smear of cervix Rx: - Cytology - PAP( [place])  2. Vaginal discharge Rx: - Cervicovaginal ancillary only( [place]) - metroNIDAZOLE (FLAGYL) 500 MG tablet; Take 1 tablet (500 mg total) by mouth 2 (two) times daily.  Dispense: 14 tablet; Refill: 2  3. Screening for STD (sexually transmitted disease) Rx: - Hepatitis B surface antigen - Hepatitis C antibody - HIV Antibody (routine testing w rflx) - RPR  4. Encounter for surveillance of implantable subdermal contraceptive - doing well     Plan:    Education reviewed: calcium supplements, depression evaluation,  low fat, low cholesterol diet, safe sex/STD prevention, self breast exams, smoking cessation, and weight bearing exercise. Contraception: Nexplanon. Follow up in: 1 year.   Meds ordered this encounter  Medications   metroNIDAZOLE (FLAGYL) 500 MG tablet    Sig: Take 1 tablet (500 mg total) by mouth 2 (two) times daily.    Dispense:  14 tablet    Refill:  2   Orders Placed This Encounter  Procedures   Hepatitis B surface antigen   Hepatitis C antibody   HIV Antibody (routine testing w rflx)   RPR     02/21/2021, MD 03/06/2021 10:08 AM

## 2021-03-06 NOTE — Progress Notes (Signed)
Patient presents for AEX. Patient complains of having vaginal discharge and odor. She also states that she started her last cycle on 1/18 which lasted for about 7 days. She stopped for 2 days and then started back and is still bleeding. She states that bleeding is not heavy.  Last pap: 10/01/2016 Normal

## 2021-03-07 LAB — HEPATITIS B SURFACE ANTIGEN: Hepatitis B Surface Ag: NEGATIVE

## 2021-03-07 LAB — CERVICOVAGINAL ANCILLARY ONLY
Bacterial Vaginitis (gardnerella): POSITIVE — AB
Candida Glabrata: NEGATIVE
Candida Vaginitis: POSITIVE — AB
Chlamydia: NEGATIVE
Comment: NEGATIVE
Comment: NEGATIVE
Comment: NEGATIVE
Comment: NEGATIVE
Comment: NEGATIVE
Comment: NORMAL
Neisseria Gonorrhea: NEGATIVE
Trichomonas: POSITIVE — AB

## 2021-03-07 LAB — HEPATITIS C ANTIBODY: Hep C Virus Ab: <0.1 s/co ratio (ref 0.0–0.9)

## 2021-03-07 LAB — HIV ANTIBODY (ROUTINE TESTING W REFLEX): HIV Screen 4th Generation wRfx: NONREACTIVE

## 2021-03-07 LAB — RPR: RPR Ser Ql: NONREACTIVE

## 2021-03-10 ENCOUNTER — Other Ambulatory Visit: Payer: Self-pay | Admitting: Obstetrics

## 2021-03-10 DIAGNOSIS — B379 Candidiasis, unspecified: Secondary | ICD-10-CM

## 2021-03-10 LAB — CYTOLOGY - PAP
Comment: NEGATIVE
Diagnosis: NEGATIVE
Diagnosis: REACTIVE
High risk HPV: NEGATIVE

## 2021-03-10 MED ORDER — FLUCONAZOLE 150 MG PO TABS: 150.0000 mg | ORAL_TABLET | Freq: Once | ORAL | 0 refills | Status: DC

## 2021-03-11 ENCOUNTER — Encounter: Payer: Self-pay | Admitting: *Deleted

## 2021-03-11 NOTE — Progress Notes (Signed)
TC to patient. Notified of yeast, BV and trich and of RX's sent. Advised no sex until 1 week after treatment is complete and that partner needs treatment. Patient verbalized understanding. MyChart message including education sent.

## 2021-03-27 ENCOUNTER — Ambulatory Visit: Payer: Medicaid Other | Admitting: Obstetrics and Gynecology

## 2021-10-07 ENCOUNTER — Encounter (HOSPITAL_COMMUNITY): Payer: Self-pay

## 2021-10-07 ENCOUNTER — Ambulatory Visit (HOSPITAL_COMMUNITY)
Admission: EM | Admit: 2021-10-07 | Discharge: 2021-10-07 | Disposition: A | Discharge status: Home / Self Care | Payer: Medicaid Other | Attending: Family Medicine | Admitting: Family Medicine

## 2021-10-07 DIAGNOSIS — L02411 Cutaneous abscess of right axilla: Secondary | ICD-10-CM

## 2021-10-07 MED ORDER — DOXYCYCLINE HYCLATE 100 MG PO CAPS: 100.0000 mg | ORAL_CAPSULE | Freq: Two times a day (BID) | ORAL | 0 refills | Status: AC

## 2021-10-07 MED ORDER — HYDROCODONE-ACETAMINOPHEN 5-325 MG PO TABS: 1.0000 | ORAL_TABLET | Freq: Four times a day (QID) | ORAL | 0 refills | Status: AC | PRN

## 2021-10-07 MED ORDER — LIDOCAINE-EPINEPHRINE 1 %-1:100000 IJ SOLN
INTRAMUSCULAR | Status: AC
  Filled 2021-10-07: qty 1

## 2021-10-07 NOTE — ED Triage Notes (Signed)
Pt c/o abscess under rt arm x4 days. Denies drainage or taking meds.

## 2021-10-07 NOTE — Discharge Instructions (Signed)

## 2021-10-07 NOTE — ED Provider Notes (Signed)
[facility]   [id number] 10/07/21 Arrival Time: 0907  ASSESSMENT & PLAN:  1. Abscess of axilla, right     Incision and Drainage Procedure Note  Anesthesia: 1% lidocaine with epinephrine  Procedure Details  The procedure, risks and complications have been discussed in detail (including, but not limited to pain and bleeding) with the patient.  The skin induration was prepped and draped in the usual fashion. After adequate local anesthesia, I&D with a #11 blade was performed on the right AXILLA with purulent drainage.  EBL: minimal Drains: none Packing: 1/4" iodoform Condition: Tolerated procedure well Complications: none.  Meds ordered this encounter  Medications   HYDROcodone-acetaminophen (NORCO/VICODIN) 5-325 MG tablet    Sig: Take 1 tablet by mouth every 6 (six) hours as needed for moderate pain or severe pain.    Dispense:  8 tablet    Refill:  0   doxycycline (VIBRAMYCIN) 100 MG capsule    Sig: Take 1 capsule (100 mg total) by mouth 2 (two) times daily.    Dispense:  14 capsule    Refill:  0    Wound care instructions discussed and given in written format. To return in 48 hours for wound check and packing removal.  Finish all antibiotics. OTC analgesics as needed. [place] Controlled Substances Registry consulted for this patient. I feel the risk/benefit ratio today is favorable for proceeding with this prescription for a controlled substance. Medication sedation precautions given.  Reviewed expectations re: course of current medical issues. Questions answered. Outlined signs and symptoms indicating need for more acute intervention. Patient verbalized understanding. After Visit Summary given.   SUBJECTIVE:  Pamela [name] is a 36 y.o. female who presents with a possible infection of her R axilla. Onset gradual, approximately 4 days ago without active drainage and without active bleeding. Symptoms have gradually worsened since beginning. Fever: absent.  OTC/home treatment: none.   OBJECTIVE:  Vitals:   10/07/21 1027  BP: 117/80  Pulse: 83  Resp: 18  Temp: 97.9 F (36.6 C)  TempSrc: Oral  SpO2: 97%     General appearance: alert; no distress R axilla: approx 1 x 1.5 cm induration; tender to touch; no active drainage or bleeding Psychological: alert and cooperative; normal mood and affect  Allergies  Allergen Reactions   Penicillins Hives     Has patient had a PCN reaction causing immediate rash, facial/tongue/throat swelling, SOB or lightheadedness with hypotension: Unknown Has patient had a PCN reaction causing severe rash involving mucus membranes or skin necrosis: Unknown Has patient had a PCN reaction that required hospitalization No Has patient had a PCN reaction occurring within the last 10 years: No If all of the above answers are "NO", then may proceed with Cephalosporin use.     Past Medical History:  Diagnosis Date   Depression    doing ok   Gonorrhea    Infection    UTI   Myocardial infarction [facility])    pt stated age 37 had MI as a result of cocaine use   Ovarian cyst    Pregnancy induced hypertension    Preterm labor    Vaginal Pap smear, abnormal    had bx   Social History   Socioeconomic History   Marital status: Single    Spouse name: Not on file   Number of children: Not on file   Years of education: Not on file   Highest education level: Not on file  Occupational History   Not on file  Tobacco  Use   Smoking status: Some Days    Packs/day: 1.00    Years: 12.00    Total pack years: 12.00    Types: Cigarettes   Smokeless tobacco: Never  Vaping Use   Vaping Use: Never used  Substance and Sexual Activity   Alcohol use: No   Drug use: Yes    Types: Cocaine    Comment: last used cocaine last week   Sexual activity: Yes    Partners: Male    Birth control/protection: Implant  Other Topics Concern   Not on file  Social History Narrative   Not on file   Social Determinants of Health    Financial Resource Strain: Not on file  Food Insecurity: Not on file  Transportation Needs: Not on file  Physical Activity: Not on file  Stress: Not on file  Social Connections: Not on file   Family History  Family history unknown: Yes   Past Surgical History:  Procedure Laterality Date   NO PAST SURGERIES              Mardella Layman, MD 10/07/21 1139

## 2021-12-08 ENCOUNTER — Ambulatory Visit: Payer: Medicaid Other | Admitting: Obstetrics and Gynecology

## 2022-01-06 ENCOUNTER — Ambulatory Visit (INDEPENDENT_AMBULATORY_CARE_PROVIDER_SITE_OTHER): Payer: Medicaid Other | Admitting: Obstetrics and Gynecology

## 2022-01-06 ENCOUNTER — Encounter: Payer: Self-pay | Admitting: Obstetrics and Gynecology

## 2022-01-06 VITALS — BP 111/75 | HR 90 | Wt 119.5 lb

## 2022-01-06 DIAGNOSIS — Z3046 Encounter for surveillance of implantable subdermal contraceptive: Secondary | ICD-10-CM

## 2022-01-06 LAB — POCT URINE PREGNANCY: Preg Test, Ur: NEGATIVE

## 2022-01-06 MED ORDER — ETONOGESTREL 68 MG ~~LOC~~ IMPL
68.0000 mg | DRUG_IMPLANT | Freq: Once | SUBCUTANEOUS | Status: AC
  Administered 2022-01-06: 68 mg via SUBCUTANEOUS

## 2022-01-06 NOTE — Progress Notes (Signed)
     GYNECOLOGY OFFICE PROCEDURE NOTE  Pamela [name] is a 36 y.o. [id number] here for Nexplanon removal and Nexplanon reinsertion.  Last pap smear was on 03/06/21 and was normal.  No other gynecologic concerns.   Nexplanon Removal and Reinsertion Patient identified, informed consent performed, consent signed.   Patient does understand that irregular bleeding is a very common side effect of this medication. She was advised to have backup contraception for one week after replacement of the implant. Pregnancy test in clinic today was negative.  Appropriate time out taken. Nexplanon site identified in right arm.  Area prepped in usual sterile fashon. One ml of 1% lidocaine was used to anesthetize the area at the distal end of the implant. A small stab incision was made right beside the implant on the distal portion. The Nexplanon rod was grasped using hemostats and removed without difficulty. There was minimal blood loss. There were no complications. Area was then injected with 3 ml of 1 % lidocaine. She was re-prepped with betadine, Nexplanon removed from packaging, Device confirmed in needle, then inserted full length of needle and withdrawn per handbook instructions. Nexplanon was able to palpated in the patient's arm; patient palpated the insert herself.  There was minimal blood loss. Patient insertion site covered with gauze and a pressure bandage to reduce any bruising. The patient tolerated the procedure well and was given post procedure instructions.  She was advised to have backup contraception for one week.      Mariel Aloe, MD, FACOG Obstetrician & Gynecologist, [facility] for [facility], [facility]

## 2022-01-06 NOTE — Addendum Note (Signed)
Addended by: Natale Milch D on: 01/06/2022 04:28 PM   Modules accepted: Orders

## 2022-01-06 NOTE — Progress Notes (Signed)
Pt is in the office for nexplanon removal/re-insertion, originally inserted May 2020.

## 2022-05-25 ENCOUNTER — Other Ambulatory Visit (HOSPITAL_COMMUNITY)
Admission: RE | Admit: 2022-05-25 | Discharge: 2022-05-25 | Disposition: A | Discharge status: Home / Self Care | Payer: Medicaid Other | Source: Ambulatory Visit | Attending: Obstetrics | Admitting: Obstetrics

## 2022-05-25 ENCOUNTER — Ambulatory Visit (INDEPENDENT_AMBULATORY_CARE_PROVIDER_SITE_OTHER): Payer: Medicaid Other | Admitting: Obstetrics

## 2022-05-25 ENCOUNTER — Encounter: Payer: Self-pay | Admitting: Obstetrics

## 2022-05-25 VITALS — BP 114/79 | HR 91 | Ht 64.0 in | Wt 120.0 lb

## 2022-05-25 DIAGNOSIS — Z0000 Encounter for general adult medical examination without abnormal findings: Secondary | ICD-10-CM

## 2022-05-25 DIAGNOSIS — N898 Other specified noninflammatory disorders of vagina: Secondary | ICD-10-CM | POA: Insufficient documentation

## 2022-05-25 DIAGNOSIS — Z01419 Encounter for gynecological examination (general) (routine) without abnormal findings: Secondary | ICD-10-CM | POA: Diagnosis not present

## 2022-05-25 DIAGNOSIS — Z113 Encounter for screening for infections with a predominantly sexual mode of transmission: Secondary | ICD-10-CM

## 2022-05-25 DIAGNOSIS — Z3046 Encounter for surveillance of implantable subdermal contraceptive: Secondary | ICD-10-CM

## 2022-05-25 DIAGNOSIS — F17210 Nicotine dependence, cigarettes, uncomplicated: Secondary | ICD-10-CM

## 2022-05-25 NOTE — Progress Notes (Signed)
Pt presents for AEX.  Last PAP 03/2021 Pt requesting PAP and STD testing.

## 2022-05-25 NOTE — Progress Notes (Signed)
Subjective:        Pamela [name] is a 37 y.o. female here for a routine exam.  Current complaints: Vaginal discharge.    Personal health questionnaire:  Is patient Ashkenazi Jewish, have a family history of breast and/or ovarian cancer: no Is there a family history of uterine cancer diagnosed at age < 47, gastrointestinal cancer, urinary tract cancer, family member who is a Personnel officer syndrome-associated carrier: no Is the patient overweight and hypertensive, family history of diabetes, personal history of gestational diabetes, preeclampsia or PCOS: no Is patient over 81, have PCOS,  family history of premature CHD under age 91, diabetes, smoke, have hypertension or peripheral artery disease:  no At any time, has a partner hit, kicked or otherwise hurt or frightened you?: no Over the past 2 weeks, have you felt down, depressed or hopeless?: no Over the past 2 weeks, have you felt little interest or pleasure in doing things?:no   Gynecologic History No LMP recorded. Patient has had an implant. Contraception: Nexplanon Last Pap: 2023. Results were: normal Last mammogram: n/a. Results were: n/a  Obstetric History OB History  Gravida Para Term Preterm AB Living  SAB IAB Ectopic Multiple Live Births        0 7    # Outcome Date GA Lbr Len/2nd Weight Sex Delivery Anes PTL Lv  7 Preterm 06/17/16 [redacted]w[redacted]d 10:13 / 00:36 5 lb 1.5 oz (2.31 kg) F Vag-Spont EPI  LIV     Birth Comments: premature; cyanotic transferred to NICU  6 Preterm 07/03/14  02:50 / 00:10 3 lb 9.9 oz (1.64 kg) F Vag-Spont None  LIV     Complications: Preterm labor  5 Preterm 07/25/13 [redacted]w[redacted]d  6 lb 11 oz (3.033 kg) F Vag-Spont None  LIV     Birth Comments: *apgars assigned by EMS to be 8/9*  4 Preterm 11/2009 [redacted]w[redacted]d   M Vag-Spont EPI N LIV  3 Term 07/2008 [redacted]w[redacted]d   M Vag-Spont EPI N LIV  2 Term 2004 [redacted]w[redacted]d   F Vag-Spont EPI N LIV  1 Term             Past Medical History:  Diagnosis Date   Depression     doing ok   Gonorrhea    Infection    UTI   Myocardial infarction    pt stated age 31 had MI as a result of cocaine use   Ovarian cyst    Pregnancy induced hypertension    Preterm labor    Vaginal Pap smear, abnormal    had bx    Past Surgical History:  Procedure Laterality Date   NO PAST SURGERIES       Current Outpatient Medications:    acetaminophen (TYLENOL) 325 MG tablet, Take 650 mg by mouth every 6 (six) hours as needed for mild pain. (Patient not taking: Reported on 01/06/2022), Disp: , Rfl:    doxycycline (VIBRAMYCIN) 100 MG capsule, Take 1 capsule (100 mg total) by mouth 2 (two) times daily. (Patient not taking: Reported on 01/06/2022), Disp: 14 capsule, Rfl: 0   HYDROcodone-acetaminophen (NORCO/VICODIN) 5-325 MG tablet, Take 1 tablet by mouth every 6 (six) hours as needed for moderate pain or severe pain. (Patient not taking: Reported on 01/06/2022), Disp: 8 tablet, Rfl: 0 Allergies  Allergen Reactions   Penicillins Hives     Has patient had a PCN reaction causing immediate rash, facial/tongue/throat swelling, SOB or lightheadedness with hypotension: Unknown Has patient  had a PCN reaction causing severe rash involving mucus membranes or skin necrosis: Unknown Has patient had a PCN reaction that required hospitalization No Has patient had a PCN reaction occurring within the last 10 years: No If all of the above answers are "NO", then may proceed with Cephalosporin use.     Social History   Tobacco Use   Smoking status: Every Day    Packs/day: 1.00    Years: 12.00    Additional pack years: 0.00    Total pack years: 12.00    Types: Cigarettes   Smokeless tobacco: Never  Substance Use Topics   Alcohol use: No    Family History  Family history unknown: Yes      Review of Systems  Constitutional: negative for fatigue and weight loss Respiratory: negative for cough and wheezing Cardiovascular: negative for chest pain, fatigue and palpitations Gastrointestinal:  negative for abdominal pain and change in bowel habits Musculoskeletal:negative for myalgias Neurological: negative for gait problems and tremors Behavioral/Psych: negative for abusive relationship, depression Endocrine: negative for temperature intolerance    Genitourinary: positive for vaginal discharge.  negative for abnormal menstrual periods, genital lesions, hot flashes, sexual problems  Integument/breast: negative for breast lump, breast tenderness, nipple discharge and skin lesion(s)    Objective:       BP 114/79   Pulse 91   Ht  (1.626 m)   Wt 120 lb (54.4 kg)   BMI 20.60 kg/m  General:   Alert and no distress  Skin:   no rash or abnormalities  Lungs:   clear to auscultation bilaterally  Heart:   regular rate and rhythm, S1, S2 normal, no murmur, click, rub or gallop  Breasts:   normal without suspicious masses, skin or nipple changes or axillary nodes  Abdomen:  normal findings: no organomegaly, soft, non-tender and no hernia  Pelvis:  External genitalia: normal general appearance Urinary system: urethral meatus normal and bladder without fullness, nontender Vaginal: normal without tenderness, induration or masses Cervix: normal appearance Adnexa: normal bimanual exam Uterus: anteverted and non-tender, normal size   Lab Review Urine pregnancy test Labs reviewed yes Radiologic studies reviewed no  I have spent a total of 20 minutes of face-to-face time, excluding clinical staff time, reviewing notes and preparing to see patient, ordering tests and/or medications, and counseling the patient.   Assessment:    1. Encounter for gynecological examination with Papanicolaou smear of cervix Rx: - Cytology - PAP( [place])  2. Vaginal discharge Rx: - Cervicovaginal ancillary only( [place])  3. Screening for STD (sexually transmitted disease) Rx: - HIV antibody (with reflex) - RPR - Hepatitis C Antibody - Hepatitis B Surface AntiGEN  4. Encounter for  surveillance of implantable subdermal contraceptive - pleased with Nexplanon  5. Tobacco dependence due to cigarettes - cessation recommended     Plan:    Education reviewed: calcium supplements, depression evaluation, low fat, low cholesterol diet, safe sex/STD prevention, self breast exams, smoking cessation, and weight bearing exercise. Contraception: Nexplanon. Follow up in: 1 year.    Orders Placed This Encounter  Procedures   HIV antibody (with reflex)   RPR   Hepatitis C Antibody   Hepatitis B Surface AntiGEN    Brock Bad, MD 05/25/2022 3:18 PM

## 2022-05-26 LAB — CERVICOVAGINAL ANCILLARY ONLY
Bacterial Vaginitis (gardnerella): POSITIVE — AB
Candida Glabrata: NEGATIVE
Candida Vaginitis: POSITIVE — AB
Chlamydia: NEGATIVE
Comment: NEGATIVE
Comment: NEGATIVE
Comment: NEGATIVE
Comment: NEGATIVE
Comment: NEGATIVE
Comment: NORMAL
Neisseria Gonorrhea: NEGATIVE
Trichomonas: POSITIVE — AB

## 2022-05-26 LAB — HEPATITIS C ANTIBODY: Hep C Virus Ab: NONREACTIVE

## 2022-05-26 LAB — HEPATITIS B SURFACE ANTIGEN: Hepatitis B Surface Ag: NEGATIVE

## 2022-05-26 LAB — RPR: RPR Ser Ql: NONREACTIVE

## 2022-05-26 LAB — HIV ANTIBODY (ROUTINE TESTING W REFLEX): HIV Screen 4th Generation wRfx: NONREACTIVE

## 2022-05-27 ENCOUNTER — Other Ambulatory Visit: Payer: Self-pay | Admitting: Obstetrics

## 2022-05-27 DIAGNOSIS — B3731 Acute candidiasis of vulva and vagina: Secondary | ICD-10-CM

## 2022-05-27 DIAGNOSIS — N760 Acute vaginitis: Secondary | ICD-10-CM

## 2022-05-27 DIAGNOSIS — A599 Trichomoniasis, unspecified: Secondary | ICD-10-CM

## 2022-05-27 MED ORDER — METRONIDAZOLE 500 MG PO TABS: 500.0000 mg | ORAL_TABLET | Freq: Two times a day (BID) | ORAL | 2 refills | Status: AC

## 2022-05-27 MED ORDER — FLUCONAZOLE 150 MG PO TABS: 150.0000 mg | ORAL_TABLET | Freq: Once | ORAL | 0 refills | Status: AC

## 2022-05-28 ENCOUNTER — Telehealth: Payer: Self-pay

## 2022-05-28 DIAGNOSIS — B379 Candidiasis, unspecified: Secondary | ICD-10-CM

## 2022-05-28 LAB — CYTOLOGY - PAP
Comment: NEGATIVE
Diagnosis: NEGATIVE
High risk HPV: NEGATIVE

## 2022-05-28 MED ORDER — FLUCONAZOLE 150 MG PO TABS: 150.0000 mg | ORAL_TABLET | Freq: Once | ORAL | 0 refills | Status: AC

## 2022-05-28 NOTE — Telephone Encounter (Signed)
S/w pt and advised of results, treatment plan, and rx
